# Patient Record
Sex: Male | Born: 1950 | Race: Black or African American | Hispanic: No | Marital: Single
Health system: Southern US, Community
[De-identification: ages and names within clinical notes are randomized; demographics above are authoritative.]

---

## 2014-09-10 ENCOUNTER — Inpatient Hospital Stay (HOSPITAL_COMMUNITY)
Admission: AD | Admit: 2014-09-10 | Discharge: 2014-09-13 | DRG: 871 | Disposition: A | Discharge status: Other Acute Inpatient Hospital | Payer: Medicare Other | Source: Other Acute Inpatient Hospital | Attending: Internal Medicine | Admitting: Internal Medicine

## 2014-09-10 DIAGNOSIS — Z930 Tracheostomy status: Secondary | ICD-10-CM | POA: Diagnosis not present

## 2014-09-10 DIAGNOSIS — E1310 Other specified diabetes mellitus with ketoacidosis without coma: Secondary | ICD-10-CM | POA: Diagnosis not present

## 2014-09-10 DIAGNOSIS — J9690 Respiratory failure, unspecified, unspecified whether with hypoxia or hypercapnia: Secondary | ICD-10-CM | POA: Diagnosis present

## 2014-09-10 DIAGNOSIS — F411 Generalized anxiety disorder: Secondary | ICD-10-CM

## 2014-09-10 DIAGNOSIS — Z9911 Dependence on respirator [ventilator] status: Secondary | ICD-10-CM

## 2014-09-10 DIAGNOSIS — D649 Anemia, unspecified: Secondary | ICD-10-CM | POA: Diagnosis present

## 2014-09-10 DIAGNOSIS — J9620 Acute and chronic respiratory failure, unspecified whether with hypoxia or hypercapnia: Secondary | ICD-10-CM

## 2014-09-10 DIAGNOSIS — E662 Morbid (severe) obesity with alveolar hypoventilation: Secondary | ICD-10-CM | POA: Diagnosis present

## 2014-09-10 DIAGNOSIS — G9340 Encephalopathy, unspecified: Secondary | ICD-10-CM | POA: Diagnosis present

## 2014-09-10 DIAGNOSIS — R7881 Bacteremia: Secondary | ICD-10-CM | POA: Diagnosis not present

## 2014-09-10 DIAGNOSIS — R6520 Severe sepsis without septic shock: Secondary | ICD-10-CM | POA: Diagnosis present

## 2014-09-10 DIAGNOSIS — N184 Chronic kidney disease, stage 4 (severe): Secondary | ICD-10-CM | POA: Diagnosis present

## 2014-09-10 DIAGNOSIS — N390 Urinary tract infection, site not specified: Secondary | ICD-10-CM | POA: Diagnosis present

## 2014-09-10 DIAGNOSIS — J398 Other specified diseases of upper respiratory tract: Secondary | ICD-10-CM

## 2014-09-10 DIAGNOSIS — R627 Adult failure to thrive: Secondary | ICD-10-CM

## 2014-09-10 DIAGNOSIS — E1110 Type 2 diabetes mellitus with ketoacidosis without coma: Secondary | ICD-10-CM

## 2014-09-10 DIAGNOSIS — J9611 Chronic respiratory failure with hypoxia: Secondary | ICD-10-CM

## 2014-09-10 DIAGNOSIS — E861 Hypovolemia: Secondary | ICD-10-CM | POA: Diagnosis present

## 2014-09-10 DIAGNOSIS — G4733 Obstructive sleep apnea (adult) (pediatric): Secondary | ICD-10-CM | POA: Diagnosis present

## 2014-09-10 DIAGNOSIS — R319 Hematuria, unspecified: Secondary | ICD-10-CM | POA: Diagnosis present

## 2014-09-10 DIAGNOSIS — I10 Essential (primary) hypertension: Secondary | ICD-10-CM | POA: Diagnosis not present

## 2014-09-10 DIAGNOSIS — R0600 Dyspnea, unspecified: Secondary | ICD-10-CM | POA: Diagnosis not present

## 2014-09-10 DIAGNOSIS — B9620 Unspecified Escherichia coli [E. coli] as the cause of diseases classified elsewhere: Secondary | ICD-10-CM | POA: Diagnosis present

## 2014-09-10 DIAGNOSIS — K219 Gastro-esophageal reflux disease without esophagitis: Secondary | ICD-10-CM | POA: Diagnosis present

## 2014-09-10 DIAGNOSIS — E875 Hyperkalemia: Secondary | ICD-10-CM | POA: Diagnosis not present

## 2014-09-10 DIAGNOSIS — N178 Other acute kidney failure: Secondary | ICD-10-CM | POA: Diagnosis not present

## 2014-09-10 DIAGNOSIS — A4102 Sepsis due to Methicillin resistant Staphylococcus aureus: Secondary | ICD-10-CM | POA: Diagnosis present

## 2014-09-10 DIAGNOSIS — I509 Heart failure, unspecified: Secondary | ICD-10-CM | POA: Diagnosis present

## 2014-09-10 DIAGNOSIS — Z931 Gastrostomy status: Secondary | ICD-10-CM

## 2014-09-10 DIAGNOSIS — T839XXA Unspecified complication of genitourinary prosthetic device, implant and graft, initial encounter: Secondary | ICD-10-CM

## 2014-09-10 DIAGNOSIS — Z87891 Personal history of nicotine dependence: Secondary | ICD-10-CM

## 2014-09-10 DIAGNOSIS — Z4659 Encounter for fitting and adjustment of other gastrointestinal appliance and device: Secondary | ICD-10-CM

## 2014-09-10 DIAGNOSIS — I129 Hypertensive chronic kidney disease with stage 1 through stage 4 chronic kidney disease, or unspecified chronic kidney disease: Secondary | ICD-10-CM | POA: Diagnosis present

## 2014-09-10 DIAGNOSIS — B964 Proteus (mirabilis) (morganii) as the cause of diseases classified elsewhere: Secondary | ICD-10-CM | POA: Diagnosis present

## 2014-09-10 DIAGNOSIS — J9622 Acute and chronic respiratory failure with hypercapnia: Secondary | ICD-10-CM | POA: Diagnosis not present

## 2014-09-10 DIAGNOSIS — Z6841 Body Mass Index (BMI) 40.0 and over, adult: Secondary | ICD-10-CM | POA: Diagnosis not present

## 2014-09-10 DIAGNOSIS — N179 Acute kidney failure, unspecified: Secondary | ICD-10-CM | POA: Diagnosis present

## 2014-09-10 DIAGNOSIS — E1122 Type 2 diabetes mellitus with diabetic chronic kidney disease: Secondary | ICD-10-CM | POA: Diagnosis present

## 2014-09-10 DIAGNOSIS — A419 Sepsis, unspecified organism: Secondary | ICD-10-CM

## 2014-09-10 LAB — STREP PNEUMONIAE URINARY ANTIGEN: Strep Pneumo Urinary Antigen: NEGATIVE

## 2014-09-10 LAB — COMPREHENSIVE METABOLIC PANEL
ALT: 16 U/L — ABNORMAL LOW (ref 17–63)
AST: 8 U/L — ABNORMAL LOW (ref 15–41)
Albumin: 2.2 g/dL — ABNORMAL LOW (ref 3.5–5.0)
Alkaline Phosphatase: 56 U/L (ref 38–126)
Anion gap: 10 (ref 5–15)
BILIRUBIN TOTAL: 0.4 mg/dL (ref 0.3–1.2)
BUN: 106 mg/dL — ABNORMAL HIGH (ref 6–20)
CO2: 26 mmol/L (ref 22–32)
Calcium: 8 mg/dL — ABNORMAL LOW (ref 8.9–10.3)
Chloride: 97 mmol/L — ABNORMAL LOW (ref 101–111)
Creatinine, Ser: 3.83 mg/dL — ABNORMAL HIGH (ref 0.61–1.24)
GFR calc non Af Amer: 16 mL/min — ABNORMAL LOW (ref >60)
GFR, EST AFRICAN AMERICAN: 18 mL/min — AB (ref >60)
Glucose, Bld: 138 mg/dL — ABNORMAL HIGH (ref 65–99)
Potassium: 5.1 mmol/L (ref 3.5–5.1)
Sodium: 133 mmol/L — ABNORMAL LOW (ref 135–145)
TOTAL PROTEIN: 6 g/dL — AB (ref 6.5–8.1)

## 2014-09-10 LAB — POCT I-STAT 3, ART BLOOD GAS (G3+)
Acid-Base Excess: 3 mmol/L — ABNORMAL HIGH (ref 0.0–2.0)
BICARBONATE: 28.5 meq/L — AB (ref 20.0–24.0)
O2 Saturation: 99 %
PH ART: 7.381 (ref 7.350–7.450)
PO2 ART: 158 mmHg — AB (ref 80.0–100.0)
TCO2: 30 mmol/L (ref 0–100)
pCO2 arterial: 48.1 mmHg — ABNORMAL HIGH (ref 35.0–45.0)

## 2014-09-10 LAB — URINALYSIS, ROUTINE W REFLEX MICROSCOPIC
BILIRUBIN URINE: NEGATIVE
Glucose, UA: NEGATIVE mg/dL
KETONES UR: NEGATIVE mg/dL
NITRITE: NEGATIVE
PH: 5 (ref 5.0–8.0)
PROTEIN: 100 mg/dL — AB
Specific Gravity, Urine: 1.015 (ref 1.005–1.030)
UROBILINOGEN UA: 0.2 mg/dL (ref 0.0–1.0)

## 2014-09-10 LAB — PHOSPHORUS: Phosphorus: 5.7 mg/dL — ABNORMAL HIGH (ref 2.5–4.6)

## 2014-09-10 LAB — MAGNESIUM: MAGNESIUM: 2 mg/dL (ref 1.7–2.4)

## 2014-09-10 LAB — CBC
HCT: 20.8 % — ABNORMAL LOW (ref 39.0–52.0)
Hemoglobin: 6.4 g/dL — CL (ref 13.0–17.0)
MCH: 26.9 pg (ref 26.0–34.0)
MCHC: 30.8 g/dL (ref 30.0–36.0)
MCV: 87.4 fL (ref 78.0–100.0)
Platelets: 282 10*3/uL (ref 150–400)
RBC: 2.38 MIL/uL — ABNORMAL LOW (ref 4.22–5.81)
RDW: 18.5 % — ABNORMAL HIGH (ref 11.5–15.5)
WBC: 12.4 10*3/uL — AB (ref 4.0–10.5)

## 2014-09-10 LAB — GLUCOSE, CAPILLARY
GLUCOSE-CAPILLARY: 154 mg/dL — AB (ref 65–99)
Glucose-Capillary: 151 mg/dL — ABNORMAL HIGH (ref 65–99)
Glucose-Capillary: 107 mg/dL — ABNORMAL HIGH (ref 65–99)

## 2014-09-10 LAB — LACTIC ACID, PLASMA: Lactic Acid, Venous: 0.8 mmol/L (ref 0.5–2.0)

## 2014-09-10 LAB — APTT: aPTT: 38 seconds — ABNORMAL HIGH (ref 24–37)

## 2014-09-10 LAB — AMYLASE: Amylase: 37 U/L (ref 28–100)

## 2014-09-10 LAB — PROTIME-INR
INR: 1.18 (ref 0.00–1.49)
Prothrombin Time: 15.2 seconds (ref 11.6–15.2)

## 2014-09-10 LAB — URINE MICROSCOPIC-ADD ON

## 2014-09-10 LAB — ABO/RH: ABO/RH(D): B POS

## 2014-09-10 LAB — BRAIN NATRIURETIC PEPTIDE: B NATRIURETIC PEPTIDE 5: 752.9 pg/mL — AB (ref 0.0–100.0)

## 2014-09-10 LAB — TROPONIN I

## 2014-09-10 LAB — PROCALCITONIN: PROCALCITONIN: 4.43 ng/mL

## 2014-09-10 LAB — LIPASE, BLOOD: LIPASE: 21 U/L — AB (ref 22–51)

## 2014-09-10 LAB — PREPARE RBC (CROSSMATCH)

## 2014-09-10 MED ORDER — CETYLPYRIDINIUM CHLORIDE 0.05 % MT LIQD
7.0000 mL | Freq: Four times a day (QID) | OROMUCOSAL | Status: DC
  Administered 2014-09-10 – 2014-09-13 (×13): 7 mL via OROMUCOSAL

## 2014-09-10 MED ORDER — HEPARIN SODIUM (PORCINE) 5000 UNIT/ML IJ SOLN
5000.0000 [IU] | Freq: Three times a day (TID) | INTRAMUSCULAR | Status: DC
  Administered 2014-09-10 – 2014-09-13 (×10): 5000 [IU] via SUBCUTANEOUS
  Filled 2014-09-10 (×12): qty 1

## 2014-09-10 MED ORDER — MEROPENEM 500 MG IV SOLR
500.0000 mg | INTRAVENOUS | Status: DC
  Filled 2014-09-10: qty 0.5

## 2014-09-10 MED ORDER — CHLORHEXIDINE GLUCONATE 0.12 % MT SOLN
15.0000 mL | Freq: Two times a day (BID) | OROMUCOSAL | Status: DC
  Administered 2014-09-10 – 2014-09-13 (×6): 15 mL via OROMUCOSAL
  Filled 2014-09-10 (×8): qty 15

## 2014-09-10 MED ORDER — IPRATROPIUM-ALBUTEROL 0.5-2.5 (3) MG/3ML IN SOLN
3.0000 mL | RESPIRATORY_TRACT | Status: DC
  Administered 2014-09-10 – 2014-09-13 (×18): 3 mL via RESPIRATORY_TRACT
  Filled 2014-09-10 (×3): qty 3
  Filled 2014-09-10: qty 12
  Filled 2014-09-10 (×13): qty 3

## 2014-09-10 MED ORDER — VANCOMYCIN HCL 10 G IV SOLR
2500.0000 mg | Freq: Once | INTRAVENOUS | Status: AC
  Administered 2014-09-10: 2500 mg via INTRAVENOUS
  Filled 2014-09-10: qty 2500

## 2014-09-10 MED ORDER — MUPIROCIN 2 % EX OINT
1.0000 "application " | TOPICAL_OINTMENT | Freq: Two times a day (BID) | CUTANEOUS | Status: DC
  Administered 2014-09-10 – 2014-09-13 (×7): 1 via NASAL
  Filled 2014-09-10 (×2): qty 22

## 2014-09-10 MED ORDER — SODIUM CHLORIDE 0.9 % IV SOLN: Freq: Once | INTRAVENOUS | Status: DC

## 2014-09-10 MED ORDER — SODIUM CHLORIDE 0.9 % IV SOLN
INTRAVENOUS | Status: DC
  Administered 2014-09-10: 23:00:00 via INTRAVENOUS

## 2014-09-10 MED ORDER — PANTOPRAZOLE SODIUM 40 MG IV SOLR
40.0000 mg | Freq: Every day | INTRAVENOUS | Status: DC
  Administered 2014-09-10 – 2014-09-11 (×2): 40 mg via INTRAVENOUS
  Filled 2014-09-10 (×3): qty 40

## 2014-09-10 MED ORDER — CHLORHEXIDINE GLUCONATE CLOTH 2 % EX PADS
6.0000 | MEDICATED_PAD | Freq: Every day | CUTANEOUS | Status: DC
  Administered 2014-09-11 – 2014-09-13 (×3): 6 via TOPICAL

## 2014-09-10 MED ORDER — INSULIN ASPART 100 UNIT/ML ~~LOC~~ SOLN
0.0000 [IU] | SUBCUTANEOUS | Status: DC
  Administered 2014-09-10: 3 [IU] via SUBCUTANEOUS
  Administered 2014-09-12 – 2014-09-13 (×3): 2 [IU] via SUBCUTANEOUS
  Administered 2014-09-13: 3 [IU] via SUBCUTANEOUS
  Administered 2014-09-13 (×2): 2 [IU] via SUBCUTANEOUS
  Administered 2014-09-13: 3 [IU] via SUBCUTANEOUS

## 2014-09-10 MED ORDER — LORAZEPAM 2 MG/ML IJ SOLN
1.0000 mg | INTRAMUSCULAR | Status: DC | PRN
  Administered 2014-09-12 – 2014-09-13 (×7): 2 mg via INTRAVENOUS
  Administered 2014-09-13: 4 mg via INTRAVENOUS
  Administered 2014-09-13: 2 mg via INTRAVENOUS
  Administered 2014-09-13: 4 mg via INTRAVENOUS
  Administered 2014-09-13: 2 mg via INTRAVENOUS
  Filled 2014-09-10 (×9): qty 1
  Filled 2014-09-10: qty 2
  Filled 2014-09-10 (×2): qty 1
  Filled 2014-09-10 (×2): qty 2

## 2014-09-10 MED ORDER — SODIUM CHLORIDE 0.9 % IV SOLN
500.0000 mg | Freq: Two times a day (BID) | INTRAVENOUS | Status: DC
  Administered 2014-09-10 – 2014-09-13 (×7): 500 mg via INTRAVENOUS
  Filled 2014-09-10 (×9): qty 0.5

## 2014-09-10 NOTE — Progress Notes (Signed)
ANTIBIOTIC CONSULT NOTE - INITIAL  Pharmacy Consult for Merrem, Vancomycin Indication: rule out sepsis and MDR EColi & Proteus UTI + MRSA bacteremia  Allergies  Allergen Reactions  . Amoxicillin     rash  . Fentanyl    Patient Measurements: Height: 5\' 6"  (167.6 cm) IBW/kg (Calculated) : 63.8  Weight - 156.5 kg  Vital Signs: Temp: 97.9 F (36.6 C) (06/03 1058) Temp Source: Oral (06/03 1058) BP: 123/60 mmHg (06/03 1055) Pulse Rate: 63 (06/03 1100) Intake/Output from previous day:   Intake/Output from this shift:    Labs: No results for input(s): WBC, HGB, PLT, LABCREA, CREATININE in the last 72 hours. CrCl cannot be calculated (Unknown ideal weight.). No results for input(s): VANCOTROUGH, VANCOPEAK, VANCORANDOM, GENTTROUGH, GENTPEAK, GENTRANDOM, TOBRATROUGH, TOBRAPEAK, TOBRARND, AMIKACINPEAK, AMIKACINTROU, AMIKACIN in the last 72 hours.   Microbiology: No results found for this or any previous visit (from the past 720 hour(s)).  Medical History: No past medical history on file.  Assessment: [age] YOM transferred from [facility] (3/24 to 6/3) on 6/3 for sepsis after becoming febrile up to 102 on 5/30 with MRSA bacteremia and Proteus/EColi MDR UTI on vancomycin and merrem since 5/30. Per [facility] chart - Merrem 1g 5/31 then 500 q24 & Vanc 1g 5/31 x1, then VR level on 6/1 low at 7.3 - no further doses documented. Last dose Merrem was 6/2 at 2100. Last dose of vanc 5/31-none documented on June MAR.  Currently afebrile with copious thick secretions. AoCKD - SCr 3.83 (baseline 3.7). Estimated CrCl ~20 mL/min. Patient is making urine per RN (about 100 cc out since arrival at 10AM).   Vanc 5/31 >> VR 6/1 =7.3 after 1g x1 Merrem 5/31 >> (500 q24 @ [facility]) Cefepime 4/29 >>5/1  5/27 Blood >> MRSA 5/29 Urine >> MDR Ecoli/Proteus (S- Imipenem)  Goal of Therapy:  Vancomycin trough level 15-20 mcg/ml  Clinical resolution of infection  Plan:  Increase Merrem 500 mg IV every 12  hours.  Reload Vancomycin at 2500 mg IV now, then 2g IV every 48 hours.  Follow-up renal function, culture results, and clinical status.   Link SnufferJessica Florance Paolillo, PharmD, BCPS Clinical Pharmacist [phone] 09/10/2014,12:07 PM

## 2014-09-10 NOTE — Progress Notes (Signed)
Speech Pathology:  Orders received for swallowing evaluation.  Pt not medically ready per discussion with Dr. Delton CoombesByrum.  Per RN's discussion with [facility], pt was eating a mechanical soft diet with nectar-thick liquids PTA.  When pt medically ready, and if warranted, suggest proceeding with FEES.  SLPs certified to perform FEES will not be available until Monday, 6/6.  Karey Suthers L. Samson Fredericouture, [place] CCC/SLP Pager [phone]

## 2014-09-10 NOTE — Progress Notes (Signed)
Initial Nutrition Assessment  DOCUMENTATION CODES:  Morbid obesity  INTERVENTION:   Since will not be able to advance PO diet for at least 3 more days, recommend initiate TF via NG/OGT with Vital High Protein at 25 ml/h and Prostat 30 ml BID on day 1; on day 2, d/c Protstat and increase to goal rate of 80 ml/h (1920 ml per day) to provide 1920 kcals, 168 gm protein, 1605 ml free water daily.  NUTRITION DIAGNOSIS:  Inadequate oral intake related to inability to eat as evidenced by NPO status.  GOAL:  Provide needs based on ASPEN/SCCM guidelines  MONITOR:  Vent status, Diet advancement, PO intake, Other (Comment) (need to start enteral nutrition support)  REASON FOR ASSESSMENT:  Ventilator     ASSESSMENT: Patient admitted from [facility] on 6/3 with sepsis, acute on chronic renal failure, no shock. Source MRSA bacteremia + E coli / proteus UTI. 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   Patient usually receives a mechanical soft diet with nectar thick liquids at [facility]. SLP consulted for swallow evaluation; patient not ready for PO's at this time. SLP recommends FEES when patient able to participate. SLP can perform FEES on Monday at the earliest.   Patient is currently intubated on ventilator support MV: 11.2 L/min Temp (24hrs), Avg:97.8 F (36.6 C), Min:97.6 F (36.4 C), Max:97.9 F (36.6 C)  Propofol: none  Labs reviewed: sodium low, phosphorus high.   Height:  Ht Readings from Last 1 Encounters:  09/10/14 5\' 6"  (1.676 m)    Weight:  Wt Readings from Last 1 Encounters:  09/10/14 345 lb 1.6 oz (156.536 kg)    Ideal Body Weight:  64.5 kg  Wt Readings from Last 10 Encounters:  09/10/14 345 lb 1.6 oz (156.536 kg)    BMI:  Body mass index is 55.73 kg/(m^2).  Estimated Nutritional Needs:  Kcal:  1610-96041716-2184  Protein:  > 160 gm  Fluid:  >/= 2 L  Skin:  Reviewed, no issues  Diet Order:  Diet NPO time specified  EDUCATION  NEEDS:  No education needs identified at this time   Intake/Output Summary (Last 24 hours) at 09/10/14 1417 Last data filed at 09/10/14 1400  Gross per 24 hour  Intake    725 ml  Output    150 ml  Net    575 ml    Last BM:  unknown   Joaquin CourtsKimberly Elise Knobloch, RD, LDN, CNSC Pager [phone] After Hours Pager [phone]

## 2014-09-10 NOTE — Care Management Note (Signed)
Case Management Note  Patient Details  Name: Isaiah Montgomery MRN: [id number] Date of Birth: 1950-06-11  Subjective/Objective:   64 y.o. M admitted with Bacteremia E Coli proteus UTI. Acute on Chronic Renal Failure. Pt is from [facility].                  Action/Plan:   Expected Discharge Date:                  Expected Discharge Plan:   (From [facility]; Chronic Trach and Vent)  In-House Referral:     Discharge planning Services  CM Consult  Post Acute Care Choice:    Choice offered to:     DME Arranged:    DME Agency:     HH Arranged:    HH Agency:     Status of Service:  In process, will continue to follow  Medicare Important Message Given:    Date Medicare IM Given:    Medicare IM give by:    Date Additional Medicare IM Given:    Additional Medicare Important Message give by:     If discussed at Long Length of Stay Meetings, dates discussed:    Additional Comments:  Yvone NeuCrutchfield, [name], RN 09/10/2014, 3:34 PM

## 2014-09-10 NOTE — H&P (Signed)
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  [facility] and acute on chronic renal failure, no shock.  Source MRSA bacteremia + E coli / proteus UTI.   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 He is unable to wean due to his tracheomalacia, MO and concurrent medical issues. [facility] when on [facility]/30 he became febrile to 102. Blood cultured were + Staph(mrsa) and urine was + proteus/ecoli. His foley was dc'd 6/2 and [facility] team was unable to replace. He was refused readmit to ICU section of [facility]. He is HD stable, no foley is in place. He has a new PICC placed  6/3 and has been on vanc/merropenem x 24 hours. He further has worsening renal failure(cret 4,0) (baseline 3.7)and may need HD in near future.    PAST MEDICAL HISTORY :     Tracheostomy dependent   DM (diabetes mellitus) type 2,   Morbid obesity   Ventilator dependence   HTN (hypertension)   Tracheomalacia   FTT (failure to thrive) in adult   Bacteremia   UTI (urinary tract infection) proteus/ecoli   Hematuria   Acute renal failure   Foley catheter problem, foley out and unable to replace   Generalized anxiety disorder  Prior to Admission medications   Not on File   Allergies  Allergen Reactions  . Amoxicillin     rash  . Fentanyl     FAMILY HISTORY:  has no family status information on file.  SOCIAL  HISTORY:  Former smoker  REVIEW OF SYSTEMS:  na  SUBJECTIVE:  Awake and interacting, mild distress.  [facility]  VITAL SIGNS: Temp:  [97.6 F (36.4 C)-97.9 F (36.6 C)] 97.6 F (36.4 C) (06/03 1229) Pulse Rate:  [63-117] 117 (06/03 1300) Resp:  [17-25] 18 (06/03 1300) BP: (123-138)/(60-77) 124/75 mmHg (06/03 1300) SpO2:  [97 %-100 %] 97 % (06/03 1300) FiO2 (%):  [50 %] 50 % (06/03 1255) Weight:  [156.536 kg (345 lb 1.6 oz)] 156.536 kg (345 lb 1.6 oz) (06/03 1100) HEMODYNAMICS:   VENTILATOR SETTINGS: Vent Mode:  [-] PRVC FiO2 (%):  [50 %] 50 % Set Rate:  [14 bmp] 14 bmp Vt Set:  [600 mL] 600 mL PEEP:  [5 cmH20] 5 cmH20 Plateau Pressure:  [34 cmH20] 34 cmH20 INTAKE / OUTPUT:  Intake/Output Summary (Last 24 hours) at 09/10/14 1308 Last data filed at 09/10/14 1300  Gross per 24 hour  Intake    575 ml  Output      0 ml  Net    575 ml    PHYSICAL EXAMINATION: General: MOAAM, difficult to arouse. MAEx 4 Neuro:  No follows commands(recently sedated)MAEx 4 HEENT: Ocular edema noted, perl, oral mucosa moist Cardiovascular:  HSD Lungs:  +rhonchi and exp wheeze Abdomen:  Obese + bs, non tender, ecchymosis spotted on abd Musculoskeletal:  intact Skin:  LE edema,  chronic venous stasis   LABS:  CBC No results for input(s): WBC, HGB, HCT, PLT in the last 168 hours. Coag's No results for input(s): APTT, INR in the last 168 hours. BMET No results for input(s): NA, K, CL, CO2, BUN, CREATININE, GLUCOSE in the last 168 hours. Electrolytes No results for input(s): CALCIUM, MG, PHOS in the last 168 hours. Sepsis Markers No results for input(s): LATICACIDVEN, PROCALCITON, O2SATVEN in the last 168 hours. ABG  Recent Labs Lab 09/10/14 1143  PHART 7.381  PCO2ART 48.1*  PO2ART 158.0*   Liver Enzymes No results for input(s): AST, ALT, ALKPHOS, BILITOT, ALBUMIN in the last 168 hours. Cardiac Enzymes No results for input(s): TROPONINI, PROBNP in the last  168 hours. Glucose  Recent Labs Lab 09/10/14 1053 09/10/14 1224  GLUCAP 151* 154*    Imaging No results found.   ASSESSMENT / PLAN:  PULMONARY Trach 4/16>> A: Chronic vent dependance Chronic trach Tracheomalacia OHS OSA MO  P:   Continue current vent settings, he likely will be on vent life long NPO until Swallow eval to assess aspiration CxR/abg BD  CARDIOVASCULAR CVL6/2 left picc> A:  HTN CHF  P:  Antihypertensives as needed 2 d echo  RENAL A:   Chronic renal insuff, base creatine 3.7 P:   Volume resuscitate Place foley to accurately measure I/O Follow BMP Renal evaluation if GFR worsens, ? Whether he is an HD candidate given chronic illness.   GASTROINTESTINAL A:   GERD P:   PPI NPO for now  HEMATOLOGIC A:   No acute issues P:  CBC  INFECTIOUS A:  MRSA bacteremia E coli / proteus UTI Severe sepsis without shock P:   BCx2 5/30 mrsa UC 5/30 proteus/ecoli  Sputum 6/3>>  Abx:  5/30 vanco>> 5/30 Merrem>>  PICC has been changed on 6/2 (after vanco started), should be able to leave in place  ENDOCRINE A:   DM IDDM P:   SSI  NEUROLOGIC A:  Chronic anxiety  Encephalopathy P:   RASS goal: 0 to 1 Hold sedatives until more awake and able to assess MS  May need CT head if MS not clearing   FAMILY  - Updates: None at bedside  - Inter-disciplinary family meet or Palliative Care meeting due by: 6/10    Brett Canales Minor ACNP Adolph Pollack PCCM Pager [phone] till 3 pm If no answer page [phone] 09/10/2014, 1:08 PM  Attending Note:  I have examined patient, reviewed labs, studies and notes. I have discussed the case with S Minor, and I agree with the data and plans as amended above. Morbidly obese man with OHS, tracheomalacia that have necessitated chronic vent, tracheostomy. Transfers to [facility] with severe sepsis due to MRSA bacteremia, E coli / proteus UTI. On my eval he is ventilated, waking up, hemodynamically stable. Foley has been  placed. He has been on imipenem and vanco since 5/30. I will continue his abx, leave the new PICC in place. Try to push towards PSV, although I suspect he will not be able to achieve total vent freedom.  Independent critical care time is 50 minutes.   Levy Pupa, MD, PhD 09/10/2014, 1:12 PM [facility] [phone] or if no answer [phone]

## 2014-09-11 ENCOUNTER — Inpatient Hospital Stay (HOSPITAL_COMMUNITY): Payer: Medicare Other

## 2014-09-11 DIAGNOSIS — G9340 Encephalopathy, unspecified: Secondary | ICD-10-CM | POA: Insufficient documentation

## 2014-09-11 DIAGNOSIS — J9620 Acute and chronic respiratory failure, unspecified whether with hypoxia or hypercapnia: Secondary | ICD-10-CM

## 2014-09-11 DIAGNOSIS — A419 Sepsis, unspecified organism: Secondary | ICD-10-CM

## 2014-09-11 DIAGNOSIS — R6520 Severe sepsis without septic shock: Secondary | ICD-10-CM

## 2014-09-11 DIAGNOSIS — R0600 Dyspnea, unspecified: Secondary | ICD-10-CM

## 2014-09-11 LAB — BLOOD GAS, ARTERIAL
ACID-BASE EXCESS: 1 mmol/L (ref 0.0–2.0)
Acid-Base Excess: 1.5 mmol/L (ref 0.0–2.0)
Bicarbonate: 25.6 mEq/L — ABNORMAL HIGH (ref 20.0–24.0)
Bicarbonate: 26.1 mEq/L — ABNORMAL HIGH (ref 20.0–24.0)
Drawn by: 312761
Drawn by: 39899
FIO2: 0.5 %
FIO2: 0.5 %
LHR: 14 {breaths}/min
O2 SAT: 95.7 %
O2 Saturation: 94.6 %
PEEP/CPAP: 10 cmH2O
PEEP/CPAP: 10 cmH2O
PH ART: 7.362 (ref 7.350–7.450)
Patient temperature: 101.4
Patient temperature: 100.4
RATE: 14 resp/min
TCO2: 27 mmol/L (ref 0–100)
TCO2: 27.5 mmol/L (ref 0–100)
VT: 600 mL
VT: 600 mL
pCO2 arterial: 47.8 mmHg — ABNORMAL HIGH (ref 35.0–45.0)
pCO2 arterial: 47.7 mmHg — ABNORMAL HIGH (ref 35.0–45.0)
pH, Arterial: 7.357 (ref 7.350–7.450)
pO2, Arterial: 87.1 mmHg (ref 80.0–100.0)
pO2, Arterial: 85.8 mmHg (ref 80.0–100.0)

## 2014-09-11 LAB — CBC
HEMATOCRIT: 23.5 % — AB (ref 39.0–52.0)
HEMOGLOBIN: 7.1 g/dL — AB (ref 13.0–17.0)
MCH: 26.5 pg (ref 26.0–34.0)
MCHC: 30.2 g/dL (ref 30.0–36.0)
MCV: 87.7 fL (ref 78.0–100.0)
Platelets: 349 10*3/uL (ref 150–400)
RBC: 2.68 MIL/uL — AB (ref 4.22–5.81)
RDW: 18.6 % — AB (ref 11.5–15.5)
WBC: 10.9 10*3/uL — ABNORMAL HIGH (ref 4.0–10.5)

## 2014-09-11 LAB — URINE CULTURE: Special Requests: NORMAL

## 2014-09-11 LAB — MRSA PCR SCREENING: MRSA by PCR: POSITIVE — AB

## 2014-09-11 LAB — BASIC METABOLIC PANEL
ANION GAP: 9 (ref 5–15)
Anion gap: 11 (ref 5–15)
BUN: 107 mg/dL — ABNORMAL HIGH (ref 6–20)
BUN: 111 mg/dL — ABNORMAL HIGH (ref 6–20)
CALCIUM: 7.9 mg/dL — AB (ref 8.9–10.3)
CO2: 26 mmol/L (ref 22–32)
CO2: 25 mmol/L (ref 22–32)
CREATININE: 3.63 mg/dL — AB (ref 0.61–1.24)
Calcium: 8.2 mg/dL — ABNORMAL LOW (ref 8.9–10.3)
Chloride: 102 mmol/L (ref 101–111)
Chloride: 99 mmol/L — ABNORMAL LOW (ref 101–111)
Creatinine, Ser: 3.7 mg/dL — ABNORMAL HIGH (ref 0.61–1.24)
GFR calc Af Amer: 19 mL/min — ABNORMAL LOW (ref >60)
GFR calc non Af Amer: 17 mL/min — ABNORMAL LOW (ref >60)
GFR calc non Af Amer: 16 mL/min — ABNORMAL LOW (ref >60)
GFR, EST AFRICAN AMERICAN: 19 mL/min — AB (ref >60)
GLUCOSE: 94 mg/dL (ref 65–99)
Glucose, Bld: 107 mg/dL — ABNORMAL HIGH (ref 65–99)
Potassium: 5.6 mmol/L — ABNORMAL HIGH (ref 3.5–5.1)
Potassium: 5.1 mmol/L (ref 3.5–5.1)
Sodium: 139 mmol/L (ref 135–145)
Sodium: 133 mmol/L — ABNORMAL LOW (ref 135–145)

## 2014-09-11 LAB — GLUCOSE, CAPILLARY
Glucose-Capillary: 104 mg/dL — ABNORMAL HIGH (ref 65–99)
Glucose-Capillary: 105 mg/dL — ABNORMAL HIGH (ref 65–99)
Glucose-Capillary: 110 mg/dL — ABNORMAL HIGH (ref 65–99)
Glucose-Capillary: 90 mg/dL (ref 65–99)
Glucose-Capillary: 92 mg/dL (ref 65–99)
Glucose-Capillary: 94 mg/dL (ref 65–99)
Glucose-Capillary: 98 mg/dL (ref 65–99)

## 2014-09-11 LAB — TROPONIN I: Troponin I: <0.03 ng/mL (ref <0.031)

## 2014-09-11 LAB — CORTISOL: CORTISOL PLASMA: 7.7 ug/dL

## 2014-09-11 MED ORDER — SODIUM CHLORIDE 0.9 % IV BOLUS (SEPSIS)
1000.0000 mL | Freq: Once | INTRAVENOUS | Status: AC
  Administered 2014-09-11: 1000 mL via INTRAVENOUS

## 2014-09-11 MED ORDER — SODIUM POLYSTYRENE SULFONATE 15 GM/60ML PO SUSP
30.0000 g | Freq: Once | ORAL | Status: AC
  Administered 2014-09-11: 30 g
  Filled 2014-09-11: qty 120

## 2014-09-11 MED ORDER — VITAL HIGH PROTEIN PO LIQD
1000.0000 mL | ORAL | Status: DC
  Administered 2014-09-11: 19:00:00
  Administered 2014-09-11 – 2014-09-13 (×4): 1000 mL
  Filled 2014-09-11 (×6): qty 1000

## 2014-09-11 MED ORDER — METOPROLOL TARTRATE 1 MG/ML IV SOLN
5.0000 mg | INTRAVENOUS | Status: AC | PRN
  Administered 2014-09-12 (×2): 5 mg via INTRAVENOUS
  Filled 2014-09-11 (×2): qty 5

## 2014-09-11 MED ORDER — PRO-STAT SUGAR FREE PO LIQD
30.0000 mL | Freq: Two times a day (BID) | ORAL | Status: AC
  Administered 2014-09-11 (×2): 30 mL
  Filled 2014-09-11 (×2): qty 30

## 2014-09-11 MED ORDER — PERFLUTREN LIPID MICROSPHERE
1.0000 mL | INTRAVENOUS | Status: AC | PRN
  Administered 2014-09-11: 2 mL via INTRAVENOUS
  Filled 2014-09-11: qty 10

## 2014-09-11 NOTE — Progress Notes (Signed)
  Echocardiogram 2D Echocardiogram has been performed.  Delcie RochENNINGTON, Treyvon Blahut 09/11/2014, 9:36 AM

## 2014-09-11 NOTE — Progress Notes (Signed)
Nutrition Follow-up / Consult  DOCUMENTATION CODES:  Morbid obesity  INTERVENTION:   Initiate TF via NGT with Vital High Protein at 25 ml/h and Prostat 30 ml BID on day 1; on day 2, d/c Protstat and increase to goal rate of 80 ml/h (1920 ml per day) to provide 1920 kcals, 168 gm protein, 1605 ml free water daily.   NUTRITION DIAGNOSIS:  Inadequate oral intake related to inability to eat as evidenced by NPO status.  Ongoing  GOAL:  Provide needs based on ASPEN/SCCM guidelines  Unmet   MONITOR:  Vent status, TF tolerance, Weight trends, Labs  REASON FOR ASSESSMENT:  Consult Enteral/tube feeding initiation and management  ASSESSMENT: Patient admitted from [facility] on 6/3 with sepsis, acute on chronic renal failure, no shock. Source MRSA bacteremia + E coli / proteus UTI. 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 Consumes a mechanical soft diet with nectar thick liquids at [facility].  Since SLP unable to perform FEES until Monday at the earliest, plans to start TF after NGT placed. Received MD Consult for TF initiation and management.  Patient is currently intubated on ventilator support MV: 10.3 L/min Temp (24hrs), Avg:100.3 F (37.9 C), Min:97.4 F (36.3 C), Max:101.5 F (38.6 C)  Propofol: none   Height:  Ht Readings from Last 1 Encounters:  09/10/14 5\' 6"  (1.676 m)    Weight:  Wt Readings from Last 1 Encounters:  09/11/14 344 lb 2.2 oz (156.1 kg)   09/10/14 345 lb 1.6 oz (156.536 kg)        Ideal Body Weight:  64.5 kg  BMI:  Body mass index is 55.57 kg/(m^2).  Estimated Nutritional Needs:  Kcal:  1610-96041716-2184  Protein:  > 160 gm  Fluid:  >/= 2 L  Skin:  Reviewed, no issues  Diet Order:  Diet NPO time specified  EDUCATION NEEDS:  No education needs identified at this time   Intake/Output Summary (Last 24 hours) at 09/11/14 1243 Last data filed at 09/11/14 1208  Gross per 24 hour  Intake   2325 ml  Output   1080  ml  Net   1245 ml    Last BM:  PTA   Joaquin CourtsKimberly Viki Carrera, RD, LDN, CNSC Pager [phone] After Hours Pager [phone]

## 2014-09-11 NOTE — Progress Notes (Signed)
Attempted to contact patients family, unable to reach at this time.

## 2014-09-11 NOTE — Progress Notes (Signed)
Pt have order to transfuse unit of blood. Pt niece called multiple times and message left on voice mail to get consent for pt's transfusion.Will continue to called pt's niece. And monitor pt's labs.

## 2014-09-11 NOTE — Progress Notes (Signed)
eLink Physician-Brief Progress Note Patient Name: Isaiah DarbyWilliam Montgomery DOB: Jan 15, 1951 MRN: [id number]   Date of Service  09/11/2014  HPI/Events of Note  New A fib  eICU Interventions  Would like to avoid rate controlling meds since this is likely a result of his hypovolemia, anemia, sepsis. Working to correct underlying issues. Will order metoprolol for rate control in event he develops RVR     Intervention Category Major Interventions: Arrhythmia - evaluation and management  Kortny Lirette S. 09/11/2014, 12:11 AM

## 2014-09-11 NOTE — Progress Notes (Signed)
Pt HR increased to 110-120's A. Fib. EKG done results A, Fib RVR. Informed Dr. Delton CoombesByrum PRN orders placed.

## 2014-09-11 NOTE — Progress Notes (Signed)
PULMONARY / CRITICAL CARE MEDICINE   Name: Isaiah Montgomery MRN: [id number] DOB: Apr 16, 1950    ADMISSION DATE:  09/10/2014   REFERRING MD :  [facility]  CHIEF COMPLAINT:  FTT  INITIAL PRESENTATION:  Mr. 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 He is unable to wean due to his tracheomalacia, MO and concurrent medical issues. He was in SNF division of [facility] when on 5/30 he became febrile to 102. Blood cultured were + Staph(mrsa) and urine was + proteus/ecoli. His foley was dc'd 6/2 and [facility] team was unable to replace. He was refused readmit to ICU section of [facility] and transferred to [facility] for further treatment. He is HD stable, no foley is in place. He has a new PICC placed  6/3 and has been on vanc/merropenem x 24 hours. He further has worsening renal failure(cret 4,0) (baseline 3.7)and may need HD in near future.    EVENTS 09/10/2014  : Morbidly obese man with OHS, tracheomalacia that have necessitated chronic vent, tracheostomy. Transfers to [facility] with severe sepsis due to MRSA bacteremia, E coli / proteus UTI. On my eval he is ventilated, waking up, hemodynamically stable. Foley has been placed. He has been on imipenem and vanco since 5/30. I will continue his abx, leave the new PICC in place. Try to push towards PSV, although I suspect he will not be able to achieve total vent freedom. [facility] 09/10/2014  6/3 TTE >> 6/3 renal US>> 09/10/14: Awake and interacting, mild distress.  Foley placed this am at [facility]    SUBJECTIVE/OVERNIGHT/INTERVAL HX 09/11/14: Hgb wsa < 7gm% and now > 7gm% without transfusion. RN reporting no family availability despite multiple attempts. Phone numbers are not the right place. On vent.  Per RN:  Not as arousable as yesterday.   Labs: Creat some improved   VITAL SIGNS: Temp:   [97.4 F (36.3 C)-101.5 F (38.6 C)] 100.9 F (38.3 C) (06/04 0748) Pulse Rate:  [63-126] 109 (06/04 0805) Resp:  [15-25] 16 (06/04 0805) BP: (82-160)/(47-94) 118/64 mmHg (06/04 0805) SpO2:  [86 %-100 %] 95 % (06/04 0805) FiO2 (%):  [50 %] 50 % (06/04 0717) Weight:  [156.1 kg (344 lb 2.2 oz)-156.536 kg (345 lb 1.6 oz)] 156.1 kg (344 lb 2.2 oz) (06/04 0253) HEMODYNAMICS:   VENTILATOR SETTINGS: Vent Mode:  [-] PRVC FiO2 (%):  [50 %] 50 % Set Rate:  [14 bmp] 14 bmp Vt Set:  [600 mL] 600 mL PEEP:  [5 cmH20-10 cmH20] 10 cmH20 Plateau Pressure:  [20 cmH20-34 cmH20] 29 cmH20 INTAKE / OUTPUT:  Intake/Output Summary (Last 24 hours) at 09/11/14 0933 Last data filed at 09/11/14 0900  Gross per 24 hour  Intake   2175 ml  Output    905 ml  Net   1270 ml    PHYSICAL EXAMINATION: General: MOAAM, difficult to arouse. MAEx 4 Neuro:  RASS -2/-3 equivalent. No focal deficits HEENT: Ocular edema noted, perl, oral mucosa moist Cardiovascular:  HSD Lungs:  NO honchi and exp wheeze. Sync with vent Abdomen:  Obese + bs, non tender, ecchymosis spotted on abd Musculoskeletal:  intact Skin:  LE edema, chronic venous stasis   LABS:  PULMONARY  Recent Labs Lab 09/10/14 1143 09/11/14 0355  PHART 7.381 7.357  PCO2ART 48.1* 47.8*  PO2ART 158.0* 87.1  HCO3 28.5* 25.6*  TCO2 30 27.0  O2SAT 99.0 94.6  CBC  Recent Labs Lab 09/10/14 1250 09/11/14 0505  HGB 6.4* 7.1*  HCT 20.8* 23.5*  WBC 12.4* 10.9*  PLT 282 349    COAGULATION  Recent Labs Lab 09/10/14 1250  INR 1.18    CARDIAC   Recent Labs Lab 09/10/14 1250 09/10/14 1510 09/10/14 2329  TROPONINI <0.03 <0.03 <0.03   No results for input(s): PROBNP in the last 168 hours.   CHEMISTRY  Recent Labs Lab 09/10/14 1250 09/11/14 0505  NA 133* 139  K 5.1 5.6*  CL 97* 102  CO2 26 26  GLUCOSE 138* 94  BUN 106* 107*  CREATININE 3.83* 3.63*  CALCIUM 8.0* 8.2*  MG 2.0  --   PHOS 5.7*  --    Estimated Creatinine  Clearance: 30.1 mL/min (by C-G formula based on Cr of 3.63).   LIVER  Recent Labs Lab 09/10/14 1250  AST 8*  ALT 16*  ALKPHOS 56  BILITOT 0.4  PROT 6.0*  ALBUMIN 2.2*  INR 1.18     INFECTIOUS  Recent Labs Lab 09/10/14 1250 09/10/14 1422  LATICACIDVEN  --  0.8  PROCALCITON 4.43  --      ENDOCRINE CBG (last 3)   Recent Labs  09/11/14 0011 09/11/14 0325 09/11/14 0750  GLUCAP 90 105* 104*         IMAGING x48h  - image(s) personally visualized  -   highlighted in bold US Renal  09/11/2014   CLINICAL DATA:  Hematuria  EXAM: RENAL / URINARY TRACT ULTRASOUND COMPLETE  COMPARISON:  None.  FINDINGS: Right Kidney:  Length: 13.8 cm. Normal renal cortical thickness and grossly normal echogenicity. Small cysts are noted. No hydronephrosis. No obvious renal calculi.  Left Kidney:  Length: 14 cm. Normal renal cortical thickness and echogenicity. Small cysts are noted. No hydronephrosis or definite renal calculi.  Bladder:  Decompressed by a Foley catheter.  IMPRESSION: Small bilateral renal cysts but no worrisome renal lesions, hydronephrosis or definite renal calculi.   Electronically Signed   By: Rudie Meyer M.D.   On: 09/11/2014 09:12         ASSESSMENT / PLAN:  PULMONARY Trach 4/16>> A: Chronic vent dependance Chronic trach Tracheomalacia OHS OSA MO   - continues on full vent support 09/11/14  P:   Continue current vent settings, he likely will be on vent life long NPO until Swallow eval to assess aspiration CxR/abg BD  CARDIOVASCULAR CVL6/2 left picc> A:  HTN CHF  - severe sepsis. NOt on pressors  P:  Antihypertensives as needed 2 d echo - results pending 09/11/14  RENAL A:   Chronic renal insuff, base creatine 3.7   - creat some better with some fluids. Mild Hyperkalemia  P:   Volume resuscitate -g ive another fluid bolus Kayexalate Repeat bmet 4pm  Place foley to accurately measure I/O Follow BMP Renal evaluation if GFR worsens, ?  Whether he is an HD candidate given chronic illness.   GASTROINTESTINAL A:   GERD P:   PPI NPO for now Place panda and start tube feeds  HEMATOLOGIC A:   Anemia of critical illness - hovering around transfusion threshold. Unable t o consent family - cannot be reached  P:  CBC PRBC if hgb < 7gm%  INFECTIOUS A:  MRSA bacteremia E coli / proteus UTI Severe sepsis without shock   - not on presors  P:   BCx2 5/30 mrsa UC 5/30 proteus/ecoli  Sputum 6/3>>  Abx:  5/30 vanco>> 5/30 Merrem>>  PICC has been  changed on 6/2 (after vanco started), should be able to leave in place  ENDOCRINE A:   DM IDDM P:   SSI  NEUROLOGIC A:  Chronic anxiety  Encephalopathy  - RASS -2/-3 equivalent. Unclear if worse or better than yestrerday. Did get ativan in setting of renal insuff 09/10/14 P:   RASS goal: 0 to 1 Hold sedatives until more awake and able to assess MS  - hold to extent possibe Get CT head wo contrast Check ABG  FAMILY  - Updates: None at bedside. Cannot be reached 09/11/14  - Inter-disciplinary family meet or Palliative Care meeting due by: 6/10  GLOBAL Move to sdu. No sedation gtt need  . Dr. Kalman ShanMurali Jere Vanburen, M.D., [facility].C.C.P Pulmonary and Critical Care Medicine Staff Physician [facility] Pager: [phone], If no answer or between  15:00h - 7:00h: call [phone]  09/11/2014 9:39 AM

## 2014-09-11 NOTE — Progress Notes (Signed)
Patient transported to CT and back to room 2M02 without any complications. Will continue to monitor.  

## 2014-09-12 ENCOUNTER — Inpatient Hospital Stay (HOSPITAL_COMMUNITY): Payer: Medicare Other

## 2014-09-12 DIAGNOSIS — I10 Essential (primary) hypertension: Secondary | ICD-10-CM

## 2014-09-12 DIAGNOSIS — Z9911 Dependence on respirator [ventilator] status: Secondary | ICD-10-CM

## 2014-09-12 DIAGNOSIS — J9622 Acute and chronic respiratory failure with hypercapnia: Secondary | ICD-10-CM

## 2014-09-12 DIAGNOSIS — N178 Other acute kidney failure: Secondary | ICD-10-CM

## 2014-09-12 LAB — CBC WITH DIFFERENTIAL/PLATELET
BASOS ABS: 0 10*3/uL (ref 0.0–0.1)
Basophils Relative: 0 % (ref 0–1)
EOS PCT: 1 % (ref 0–5)
Eosinophils Absolute: 0.1 10*3/uL (ref 0.0–0.7)
HCT: 20.8 % — ABNORMAL LOW (ref 39.0–52.0)
Hemoglobin: 6.5 g/dL — CL (ref 13.0–17.0)
Lymphocytes Relative: 13 % (ref 12–46)
Lymphs Abs: 1.5 10*3/uL (ref 0.7–4.0)
MCH: 27.3 pg (ref 26.0–34.0)
MCHC: 31.3 g/dL (ref 30.0–36.0)
MCV: 87.4 fL (ref 78.0–100.0)
MONO ABS: 0.5 10*3/uL (ref 0.1–1.0)
MONOS PCT: 4 % (ref 3–12)
Neutro Abs: 9.5 10*3/uL — ABNORMAL HIGH (ref 1.7–7.7)
Neutrophils Relative %: 82 % — ABNORMAL HIGH (ref 43–77)
Platelets: 361 10*3/uL (ref 150–400)
RBC: 2.38 MIL/uL — ABNORMAL LOW (ref 4.22–5.81)
RDW: 18.7 % — ABNORMAL HIGH (ref 11.5–15.5)
WBC: 11.6 10*3/uL — ABNORMAL HIGH (ref 4.0–10.5)

## 2014-09-12 LAB — GLUCOSE, CAPILLARY
GLUCOSE-CAPILLARY: 103 mg/dL — AB (ref 65–99)
GLUCOSE-CAPILLARY: 149 mg/dL — AB (ref 65–99)
Glucose-Capillary: 116 mg/dL — ABNORMAL HIGH (ref 65–99)
Glucose-Capillary: 94 mg/dL (ref 65–99)
Glucose-Capillary: 118 mg/dL — ABNORMAL HIGH (ref 65–99)
Glucose-Capillary: 128 mg/dL — ABNORMAL HIGH (ref 65–99)

## 2014-09-12 LAB — BASIC METABOLIC PANEL
ANION GAP: 10 (ref 5–15)
BUN: 112 mg/dL — AB (ref 6–20)
CO2: 26 mmol/L (ref 22–32)
Calcium: 8 mg/dL — ABNORMAL LOW (ref 8.9–10.3)
Chloride: 104 mmol/L (ref 101–111)
Creatinine, Ser: 3.8 mg/dL — ABNORMAL HIGH (ref 0.61–1.24)
GFR calc non Af Amer: 16 mL/min — ABNORMAL LOW (ref >60)
GFR, EST AFRICAN AMERICAN: 18 mL/min — AB (ref >60)
Glucose, Bld: 123 mg/dL — ABNORMAL HIGH (ref 65–99)
Potassium: 5 mmol/L (ref 3.5–5.1)
Sodium: 140 mmol/L (ref 135–145)

## 2014-09-12 LAB — PREPARE RBC (CROSSMATCH)

## 2014-09-12 MED ORDER — CLONIDINE ORAL SUSPENSION 10 MCG/ML
0.1000 mg | Freq: Three times a day (TID) | ORAL | Status: DC
  Filled 2014-09-12 (×2): qty 10

## 2014-09-12 MED ORDER — CLONIDINE ORAL SUSPENSION 10 MCG/ML: 0.1000 mg | Freq: Three times a day (TID) | ORAL | Status: DC

## 2014-09-12 MED ORDER — CLONIDINE HCL 0.1 MG PO TABS
0.1000 mg | ORAL_TABLET | Freq: Three times a day (TID) | ORAL | Status: DC
  Filled 2014-09-12 (×2): qty 1

## 2014-09-12 MED ORDER — CLONAZEPAM 1 MG PO TABS
1.0000 mg | ORAL_TABLET | Freq: Four times a day (QID) | ORAL | Status: DC
  Administered 2014-09-12 – 2014-09-13 (×5): 1 mg
  Filled 2014-09-12 (×5): qty 1

## 2014-09-12 MED ORDER — CLONIDINE HCL 0.1 MG PO TABS
0.1000 mg | ORAL_TABLET | Freq: Three times a day (TID) | ORAL | Status: DC
  Administered 2014-09-12 – 2014-09-13 (×4): 0.1 mg
  Filled 2014-09-12 (×5): qty 1

## 2014-09-12 MED ORDER — CLONAZEPAM 0.1 MG/ML ORAL SUSPENSION
1.0000 mg | Freq: Four times a day (QID) | ORAL | Status: DC
  Filled 2014-09-12 (×3): qty 10

## 2014-09-12 MED ORDER — PANTOPRAZOLE SODIUM 40 MG PO PACK
40.0000 mg | PACK | Freq: Every day | ORAL | Status: DC
  Administered 2014-09-13: 40 mg
  Filled 2014-09-12 (×2): qty 20

## 2014-09-12 MED ORDER — VANCOMYCIN HCL 10 G IV SOLR
2000.0000 mg | INTRAVENOUS | Status: DC
  Administered 2014-09-12: 2000 mg via INTRAVENOUS
  Filled 2014-09-12: qty 2000

## 2014-09-12 NOTE — Progress Notes (Signed)
CRITICAL VALUE ALERT  Critical value received:  Hg 6.5  Date of notification:  09/12/14  Time of notification:  0640  Critical value read back:Yes.    Nurse who received alert:  Caralee AtesJason Krissy Orebaugh, RN  MD notified (1st page):  Dr Delton CoombesByrum  Time of first page:  645  MD notified (2nd page):  Time of second page:  Responding MD:  Dr. Inis SizerBryum  Time MD responded:  [phone]

## 2014-09-12 NOTE — Progress Notes (Signed)
RN had ELink camera into pt room to witness body movement episode.

## 2014-09-12 NOTE — Progress Notes (Signed)
Telephone call to niece Ceasar Monsalisa Haertel. Verbal okay to transfuse 2 units of blood. Spoke to both this RN and L. Flood Charity fundraiserN. Consent signed.

## 2014-09-12 NOTE — Progress Notes (Signed)
Called by primary RN at assess pt for unusual tremor.  On my initial arrival to the room the pt was alert and his Rt foot was moving only.  He was able to follow commands and make eye contact.  Within a minute he had fine tremors over his entire body and progressed to blinking and a chewing motion with his teeth. The pt had his eyes open but would not respond during the episode.  This lasted for and then the tremors stopped and he was able to f/c.  This appeared much like a simple partial seizure.  Had primary RN contact CCM and update them about the fine rhythmic shaking he is having. Will cont. To monitor

## 2014-09-12 NOTE — Progress Notes (Signed)
eLink Physician-Brief Progress Note Patient Name: Isaiah DarbyWilliam Montgomery DOB: 1950/08/22 MRN: [id number]   Date of Service  09/12/2014  HPI/Events of Note  Notified by RN that pt has had period s  Of tremor, especially when awakened. Not clearly seizure activity. Needs to be evaluated further - consider EEG or Neuro eval in am. I do not believe an urgent consult is needed during the night unless he has a significant change.   eICU Interventions       Intervention Category Intermediate Interventions: Other:  Velencia Lenart S. 09/12/2014, 1:27 AM

## 2014-09-12 NOTE — Progress Notes (Signed)
NG tube in place - placement verified by auscultation.   Order placed for X-ray verification.

## 2014-09-12 NOTE — Progress Notes (Signed)
RN called Rapid Response RN to assess pt for tremor vs seizure activity. Body movement is unusual for tremors and involves whole body. Arms, legs, feets, and face were involved. Pt eyes twitch and mouth makes a grinding movement. Pt is alert but unable to respond during body movement periods. Pt is alert and able to respond directly after body movement stop. Dr. Delton CoombesByrum was notified and April Pugh, RN, with RR assessed pt.

## 2014-09-12 NOTE — Progress Notes (Signed)
PULMONARY / CRITICAL CARE MEDICINE   Name: Isaiah Montgomery MRN: [id number] DOB: 15-Jan-1951    ADMISSION DATE:  09/10/2014   REFERRING MD :  [facility]  CHIEF COMPLAINT:  FTT  INITIAL PRESENTATION:  69 yobm MO, former smoker, tracheotomy dependent due to   OHS, tracheomalacia, underlying anxiety disorder, DM  transferred to [facility] hospital 3/16 with tracheostmy and full ventilator support. He is unable to wean due to his tracheomalacia, MO and concurrent medical issues. He was in SNF division of [facility] when on 5/30 he became febrile to 102. Blood cultured were + MRSA  and urine was + proteus/ecoli. His foley was dc'd 6/2 and [facility] team was unable to replace. He was refused readmit to ICU section of [facility] and transferred to [facility] for further treatment. He is HD stable, no foley is in place. He has a new PICC placed  6/3 and has been on vanc/merropenem x 24 hours at admit 6/3 with  worsening renal failure(creat 4,0) (baseline 3.7)and ? need HD in near future.    Studies 6/4 TTE > Left ventricle: The cavity size was normal. Systolic function was normal. The estimated ejection fraction was in the range of 55% to 60%. Wall motion was normal; there were no regional wall motion abnormalities. There was no evidence of elevated ventricular filling pressure by Doppler parameters. - Mitral valve: There was mild regurgitation. - Left atrium: The atrium was moderately dilated. - Right ventricle: The cavity size was mildly dilated. Wall thickness was normal. Systolic function was moderately reduced. - Right atrium: The atrium was moderately dilated 6/4 renal US>Small bilateral renal cysts but no worrisome renal lesions, hydronephrosis or definite renal calculi 6/4 CT head 1. No acute intracranial findings. 2. Atrophy and chronic white matter vascular disease. 3. Opacification of the mastoid air cells. 4. Mild mucosal thickening in the paranasal sinuses.  Events 09/10/14: Awake  and interacting, mild distress.  Foley placed this am at [facility]    SUBJECTIVE/OVERNIGHT/INTERVAL HX  variably quite agitated, tremulous, calms with ativan IV needs up to 4 mg every 4 hours     VITAL SIGNS: Temp:  [98.8 F (37.1 C)-101.6 F (38.7 C)] 100.8 F (38.2 C) (06/05 1000) Pulse Rate:  [87-137] 122 (06/05 1000) Resp:  [6-40] 24 (06/05 1000) BP: (76-177)/(29-118) 132/43 mmHg (06/05 1000) SpO2:  [90 %-100 %] 92 % (06/05 1000) FiO2 (%):  [40 %-50 %] 40 % (06/05 1000) Weight:  [351 lb 10.1 oz (159.5 kg)] 351 lb 10.1 oz (159.5 kg) (06/05 0600) HEMODYNAMICS:   VENTILATOR SETTINGS: Vent Mode:  [-] PRVC FiO2 (%):  [40 %-50 %] 40 % Set Rate:  [14 bmp] 14 bmp Vt Set:  [600 mL] 600 mL PEEP:  [10 cmH20] 10 cmH20 Plateau Pressure:  [21 cmH20-31 cmH20] 25 cmH20 INTAKE / OUTPUT:  Intake/Output Summary (Last 24 hours) at 09/12/14 1102 Last data filed at 09/12/14 0500  Gross per 24 hour  Intake 3292.5 ml  Output    825 ml  Net 2467.5 ml    PHYSICAL EXAMINATION: General: MOAAM, difficult to arouse. MAEx 4 Neuro:  RASS -2/-3 equivalent. No focal deficits HEENT: Ocular edema noted, perl, oral mucosa moist Cardiovascular:  HSD Lungs:  Min insp and exp rhonchi . Sync with vent Abdomen:  Obese + bs, non tender, ecchymosis spotted on abd Musculoskeletal:  intact Skin:  LE edema, chronic venous stasis   LABS:  PULMONARY  Recent Labs Lab 09/10/14 1143 09/11/14 0355 09/11/14 0950  PHART 7.381 7.357 7.362  PCO2ART  48.1* 47.8* 47.7*  PO2ART 158.0* 87.1 85.8  HCO3 28.5* 25.6* 26.1*  TCO2 30 27.0 27.5  O2SAT 99.0 94.6 95.7    CBC  Recent Labs Lab 09/10/14 1250 09/11/14 0505 09/12/14 0600  HGB 6.4* 7.1* 6.5*  HCT 20.8* 23.5* 20.8*  WBC 12.4* 10.9* 11.6*  PLT 282 349 361    COAGULATION  Recent Labs Lab 09/10/14 1250  INR 1.18    CARDIAC    Recent Labs Lab 09/10/14 1250 09/10/14 1510 09/10/14 2329  TROPONINI <0.03 <0.03 <0.03   No results for  input(s): PROBNP in the last 168 hours.   CHEMISTRY  Recent Labs Lab 09/10/14 1250 09/11/14 0505 09/11/14 2004 09/12/14 0600  NA 133* 139 133* 140  K 5.1 5.6* 5.1 5.0  CL 97* 102 99* 104  CO2 GLUCOSE 138* 94 107* 123*  BUN 106* 107* 111* 112*  CREATININE 3.83* 3.63* 3.70* 3.80*  CALCIUM 8.0* 8.2* 7.9* 8.0*  MG 2.0  --   --   --   PHOS 5.7*  --   --   --    Estimated Creatinine Clearance: 29.1 mL/min (by C-G formula based on Cr of 3.8).   LIVER  Recent Labs Lab 09/10/14 1250  AST 8*  ALT 16*  ALKPHOS 56  BILITOT 0.4  PROT 6.0*  ALBUMIN 2.2*  INR 1.18     INFECTIOUS  Recent Labs Lab 09/10/14 1250 09/10/14 1422  LATICACIDVEN  --  0.8  PROCALCITON 4.43  --      ENDOCRINE CBG (last 3)   Recent Labs  09/12/14 0109 09/12/14 0517 09/12/14 0901  GLUCAP 103* 116* 94         IMAGING x48h  - image(s) personally visualized  -   highlighted in bold Ct Head Wo Contrast  09/11/2014   CLINICAL DATA:  Encephalopathy sepsis and renal failure.  EXAM: CT HEAD WITHOUT CONTRAST  TECHNIQUE: Contiguous axial images were obtained from the base of the skull through the vertex without intravenous contrast.  COMPARISON:  None.  FINDINGS: No acute intracranial hemorrhage. No focal mass lesion. No CT evidence of acute infarction. No midline shift or mass effect. No hydrocephalus. Basilar cisterns are patent.  There is generalized cortical atrophy and mild ventricular dilatation. Mild periventricular white matter hypodensities. Nasogastric tube noted. Some inflammation in the paranasal sinuses. Orbits are normal. There is opacification mastoid air cells.  IMPRESSION: 1. No acute intracranial findings. 2. Atrophy and chronic white matter vascular disease. 3. Opacification of the mastoid air cells. 4. Mild mucosal thickening in the paranasal sinuses.   Electronically Signed   By: Genevive Bi M.D.   On: 09/11/2014 13:11   US Renal  09/11/2014   CLINICAL DATA:   Hematuria  EXAM: RENAL / URINARY TRACT ULTRASOUND COMPLETE  COMPARISON:  None.  FINDINGS: Right Kidney:  Length: 13.8 cm. Normal renal cortical thickness and grossly normal echogenicity. Small cysts are noted. No hydronephrosis. No obvious renal calculi.  Left Kidney:  Length: 14 cm. Normal renal cortical thickness and echogenicity. Small cysts are noted. No hydronephrosis or definite renal calculi.  Bladder:  Decompressed by a Foley catheter.  IMPRESSION: Small bilateral renal cysts but no worrisome renal lesions, hydronephrosis or definite renal calculi.   Electronically Signed   By: Rudie Meyer M.D.   On: 09/11/2014 09:12   Dg Chest Port 1 View  09/11/2014   CLINICAL DATA:  Chronic respiratory failure.  EXAM: PORTABLE CHEST - 1 VIEW  COMPARISON:  None.  FINDINGS: NG tube extends the stomach. Large cardiac silhouette. There is bilateral airspace disease and small effusions. Low lung volumes.  IMPRESSION: Cardiomegaly and pulmonary edema and low lung volumes.   Electronically Signed   By: Genevive BiStewart  Edmunds M.D.   On: 09/11/2014 13:06         ASSESSMENT / PLAN:  PULMONARY Trach 4/16>> A: Chronic vent dependance Chronic trach Tracheomalacia OHS OSA MO   - continues on full vent support    P:   Continue current vent settings, he likely will be on vent life long NPO until Swallow eval to assess aspiration      CARDIOVASCULAR CVL6/2 left picc> A:  HTN CHF   rec  Try clonidine 6/5 which should help with agitation and tachycardia       RENAL A:   Chronic renal insuff, base creatine 3.7   - creat some better with some fluids. Mild Hyperkalemia, resolved  P:   Place foley to accurately measure I/O Follow BMP   GASTROINTESTINAL A:   GERD P:   PPI NPO for now    HEMATOLOGIC A:   Anemia of critical illness - hovering around transfusion threshold. Unable t o consent family - cannot be reached  P:  trx for hbg < 7 6/5 x 2 units since tachycardic    INFECTIOUS A:  MRSA bacteremia E coli / proteus UTI Severe sepsis without shock   - not on presors  P:   BCx2 5/30 mrsa UC 5/30 proteus/ecoli  BC 6/3 x2 >>> Sputum 6/3>>  Abx:  5/30 vanco>> 5/30 Merrem>>  PICC has been changed on 6/2 (after vanco started), ok to leave in place for now  ENDOCRINE A:   DM IDDM P:   SSI  NEUROLOGIC A:  Chronic anxiety  Encephalopathy - ct head 6/4 neg    P:   Trial of clonidine and clonazepam started 6/5          Sandrea HughsMichael Treyvion Durkee, MD Pulmonary and Critical Care Medicine [facility] Cell [phone] After 5:30 PM or weekends, call [phone]

## 2014-09-13 ENCOUNTER — Inpatient Hospital Stay (HOSPITAL_COMMUNITY): Payer: Medicare Other

## 2014-09-13 DIAGNOSIS — E1310 Other specified diabetes mellitus with ketoacidosis without coma: Secondary | ICD-10-CM

## 2014-09-13 LAB — BASIC METABOLIC PANEL
Anion gap: 11 (ref 5–15)
BUN: 112 mg/dL — ABNORMAL HIGH (ref 6–20)
CALCIUM: 8.1 mg/dL — AB (ref 8.9–10.3)
CO2: 24 mmol/L (ref 22–32)
CREATININE: 3.58 mg/dL — AB (ref 0.61–1.24)
Chloride: 106 mmol/L (ref 101–111)
GFR calc non Af Amer: 17 mL/min — ABNORMAL LOW (ref >60)
GFR, EST AFRICAN AMERICAN: 20 mL/min — AB (ref >60)
GLUCOSE: 167 mg/dL — AB (ref 65–99)
Potassium: 4.8 mmol/L (ref 3.5–5.1)
Sodium: 141 mmol/L (ref 135–145)

## 2014-09-13 LAB — CBC
HEMATOCRIT: 23.7 % — AB (ref 39.0–52.0)
Hemoglobin: 7.6 g/dL — ABNORMAL LOW (ref 13.0–17.0)
MCH: 27.8 pg (ref 26.0–34.0)
MCHC: 32.1 g/dL (ref 30.0–36.0)
MCV: 86.8 fL (ref 78.0–100.0)
Platelets: 316 10*3/uL (ref 150–400)
RBC: 2.73 MIL/uL — ABNORMAL LOW (ref 4.22–5.81)
RDW: 17.8 % — ABNORMAL HIGH (ref 11.5–15.5)
WBC: 13 10*3/uL — AB (ref 4.0–10.5)

## 2014-09-13 LAB — LEGIONELLA ANTIGEN, URINE

## 2014-09-13 LAB — CULTURE, RESPIRATORY W GRAM STAIN: Special Requests: NORMAL

## 2014-09-13 LAB — GLUCOSE, CAPILLARY
GLUCOSE-CAPILLARY: 135 mg/dL — AB (ref 65–99)
GLUCOSE-CAPILLARY: 164 mg/dL — AB (ref 65–99)
Glucose-Capillary: 152 mg/dL — ABNORMAL HIGH (ref 65–99)
Glucose-Capillary: 150 mg/dL — ABNORMAL HIGH (ref 65–99)
Glucose-Capillary: 130 mg/dL — ABNORMAL HIGH (ref 65–99)

## 2014-09-13 LAB — CULTURE, RESPIRATORY

## 2014-09-13 MED ORDER — IPRATROPIUM-ALBUTEROL 0.5-2.5 (3) MG/3ML IN SOLN: 3.0000 mL | RESPIRATORY_TRACT | Status: AC

## 2014-09-13 MED ORDER — ASPIRIN 81 MG PO CHEW: 81.0000 mg | CHEWABLE_TABLET | Freq: Every day | ORAL | Status: AC

## 2014-09-13 MED ORDER — DILTIAZEM 12 MG/ML ORAL SUSPENSION: 30.0000 mg | Freq: Four times a day (QID) | ORAL | Status: AC

## 2014-09-13 MED ORDER — DILTIAZEM 12 MG/ML ORAL SUSPENSION
30.0000 mg | Freq: Four times a day (QID) | ORAL | Status: DC
  Administered 2014-09-13 (×2): 30 mg
  Filled 2014-09-13 (×6): qty 3

## 2014-09-13 MED ORDER — VITAL HIGH PROTEIN PO LIQD: 1000.0000 mL | ORAL | Status: AC

## 2014-09-13 MED ORDER — CARVEDILOL 25 MG PO TABS: 25.0000 mg | ORAL_TABLET | Freq: Two times a day (BID) | ORAL | Status: AC

## 2014-09-13 MED ORDER — MUPIROCIN 2 % EX OINT: 1.0000 "application " | TOPICAL_OINTMENT | Freq: Two times a day (BID) | CUTANEOUS | Status: AC

## 2014-09-13 MED ORDER — AMLODIPINE BESYLATE 10 MG PO TABS: 10.0000 mg | ORAL_TABLET | Freq: Every day | ORAL | Status: AC

## 2014-09-13 MED ORDER — CLONAZEPAM 1 MG PO TABS: 1.0000 mg | ORAL_TABLET | Freq: Four times a day (QID) | ORAL | Status: AC

## 2014-09-13 MED ORDER — SODIUM CHLORIDE 0.9 % IV SOLN: 500.0000 mg | Freq: Two times a day (BID) | INTRAVENOUS | Status: AC

## 2014-09-13 MED ORDER — DEXTROSE-NACL 5-0.45 % IV SOLN: INTRAVENOUS | Status: AC

## 2014-09-13 MED ORDER — VANCOMYCIN HCL 10 G IV SOLR: 2000.0000 mg | INTRAVENOUS | Status: AC

## 2014-09-13 MED ORDER — ARIPIPRAZOLE 10 MG PO TABS: 10.0000 mg | ORAL_TABLET | Freq: Every day | ORAL | Status: AC

## 2014-09-13 MED ORDER — INSULIN ASPART 100 UNIT/ML ~~LOC~~ SOLN: 0.0000 [IU] | SUBCUTANEOUS | Status: AC

## 2014-09-13 MED ORDER — FAMOTIDINE 20 MG PO TABS: 20.0000 mg | ORAL_TABLET | Freq: Two times a day (BID) | ORAL | Status: AC

## 2014-09-13 MED ORDER — CETYLPYRIDINIUM CHLORIDE 0.05 % MT LIQD
7.0000 mL | Freq: Four times a day (QID) | OROMUCOSAL | Status: AC
Start: 2014-09-13 — End: ?

## 2014-09-13 MED ORDER — PANTOPRAZOLE SODIUM 40 MG PO PACK: 40.0000 mg | PACK | Freq: Every day | ORAL | Status: AC

## 2014-09-13 MED FILL — Perflutren Lipid Microsphere IV Susp 1.1 MG/ML: INTRAVENOUS | Qty: 10 | Status: AC

## 2014-09-13 NOTE — Clinical Documentation Improvement (Signed)
Presents with MRSA Sepsis, UTI, Encephalopathy, Morbidly Obese, ARF. CHF is documented.   ECHO reveals: EF of 55 to 60% with systolic function moderately reduced; left and right atrium moderately dilated  Was being treated with Lopressor but discontinued  Please clarify the type and acuity of the patient's CHF and document findings in next progress note and include in discharge summary if applicable.   Acuity - acute, chronic, acute on chronic  Typre - Systolic, Diastolic, Systolic and Diastolic  Other Condition________________________________________  Cannot Clinically Determine  Thank You, Shellee MiloEileen T Geral Tuch ,RN Clinical Documentation Specialist:  [phone]  [facility]- Health Information Management

## 2014-09-13 NOTE — Clinical Documentation Improvement (Signed)
Patient with Sepsis; is trached and vent dependent; morbidly obese with BMI of 55.Isaiah Montgomery.  Please provide a diagnosis associated with the above clinical indicators and document findings in next progress note and include in discharge summary if applicable.  Acute Respiratory Failure Acute on Chronic Respiratory Failure Chronic Respiratory Failure Other Condition Cannot Clinically Determine   Thank You, Shellee MiloEileen T Zailee Vallely ,RN Clinical Documentation Specialist:  [phone]  [facility]- Health Information Management

## 2014-09-13 NOTE — Discharge Summary (Signed)
Physician Discharge Summary       Patient ID: Isaiah Montgomery MRN: [id number] DOB/AGE: [birth year] 63 y.o.  Admit date: 09/10/2014 Discharge date: 09/13/2014  Discharge Diagnoses:  Tracheostomy dependence  OHS/OSA Tracheomalacia  Chronic Ventilator Dependence  Severe sepsis MRSA bacteremia  Proteus and Ecoli UTI CRI stage IV CKD Acute encephalopathy  Hyperkalemia    Detailed Hospital Course:  Mr. Isaiah Montgomery is a MO, AAM, former smoker, tracheotomy dependent due to MO, OHS, tracheomalacia, underlying anxiety disorder, DM who wa transferred to [facility] 3/16 with tracheostmy and full ventilator support. He is unable to wean due to his tracheomalacia, MO and concurrent medical issues. He was in SNF division [facility] when on 5/30 he became febrile to 102. Blood cultured were + Staph(mrsa) and urine was + proteus/ecoli. His foley was dc'd 6/2 and [facility] team was unable to replace.He was  [facility]. He is HD stable, no foley is in place. He has a new PICC placed 6/3 and has been on vanc/merropenem x 24 hours. He further has worsening renal failure(cret 4,0) (baseline 3.7) [facility]  [facility] with continuation of antibiotics and supportive care. He remained hemodynamically stable. His creatinine hit plateau, his fever curve has been stable. The only new culture data identified was: moderate Stenotrophomonas maltophilia which we felt was a colonization. F/U urine showed insignificant growth, f/u blood cultures remain negative to date. At time of discharge his most active issue remains residual encephalopathy. His head CT was negative. This is likely residual from sepsis. This we think makes him require higher level of care than SNF, but would [facility]. He is ready for transfer back to [facility] as of 6/6 with the following plan of care.    Discharge Plan by active problems   Severe sepsis in  setting of MRSA bacteremia AND E coli / proteus UTI Plan Complete minimally another 4 weeks of IV vanc for bacteremia already treated at [facility] Complete 3 more days of Merrem   Acute Encephalopathy -->in setting of sepsis Plan Continue supportive care.  Decreased clonazepam started 6/5  Hope that w/ more time his MS will return to baseline   Chronic vent dependance Chronic trach Tracheomalacia OHS OSA MO - continues on full vent support  Plan:  Continue current vent settings, he likely will be on vent life long NPO until Swallow eval to assess aspiration  HTN CHF Plan Try clonidine 6/5 which should help with agitation and tachycardia  Chronic renal insuff, base creatine 3.7 - creat some better with some fluids. Mild Hyperkalemia, resolved Plan:  Place foley to accurately measure I/O Follow BMP  Significant Hospital tests/ studies  Consults:none   ECHO 6/4: The cavity size was normal. Systolic function wasnormal. The estimated ejection fraction was in the range of 55%to 60%. Wall motion was normal; there were no regional wall motion abnormalities. There was no evidence of elevatedventricular filling pressure by Doppler parameters.- Mitral valve: There was mild regurgitation.- Left atrium: The atrium was moderately dilated. - Right ventricle: The cavity size was mildly dilated. Wallthickness was normal. Systolic function was moderately reduced.  Discharge Exam: BP 130/81 mmHg  Pulse 97  Temp(Src) 100.2 F (37.9 C) (Axillary)  Resp 17  Ht 5\' 6"  (1.676 m)  Wt 163.295 kg (360 lb)  BMI 58.13 kg/m2  SpO2 95%  General: MOAAM, difficult to arouse. MAEx 4 Neuro: RASS -2/-3 equivalent. No focal deficits HEENT: Ocular  edema noted, perl, oral mucosa moist Cardiovascular: HSD Lungs: Min insp and exp rhonchi . Sync with vent Abdomen: Obese + bs, non tender, ecchymosis spotted on abd Musculoskeletal: intact Skin: LE edema, chronic venous stasis    Labs at discharge Lab Results  Component Value Date   CREATININE 3.58* 09/13/2014   BUN 112* 09/13/2014   NA 141 09/13/2014   K 4.8 09/13/2014   CL 106 09/13/2014   CO2 24 09/13/2014   Lab Results  Component Value Date   WBC 13.0* 09/13/2014   HGB 7.6* 09/13/2014   HCT 23.7* 09/13/2014   MCV 86.8 09/13/2014   PLT 316 09/13/2014   Lab Results  Component Value Date   ALT 16* 09/10/2014   AST 8* 09/10/2014   ALKPHOS 56 09/10/2014   BILITOT 0.4 09/10/2014   Lab Results  Component Value Date   INR 1.18 09/10/2014    Current radiology studies Dg Abd 1 View  09/12/2014   CLINICAL DATA:  NG tube placement.  EXAM: ABDOMEN - 1 VIEW  COMPARISON:  None.  FINDINGS: NG tube tip is in the fundus of the stomach.  No dilated bowel.  Visualized osseous structures are normal.  IMPRESSION: NG tube tip is in the fundus of the stomach.   Electronically Signed   By: Francene Boyers M.D.   On: 09/12/2014 15:33   Dg Abd Portable 1v  09/13/2014   CLINICAL DATA:  64 year old male NG tube placement. Initial encounter.  EXAM: PORTABLE ABDOMEN - 1 VIEW  COMPARISON:  09/12/2014.  FINDINGS: Portable AP supine view at 0835 hours. Enteric tube in place, side hole projects at the level of the distal gastric body just across midline. Similar non obstructed bowel gas pattern with gas in multiple nondilated bowel loops in the stomach. Stable visualized osseous structures.  IMPRESSION: NG tube in place, side hole at the distal gastric body.   Electronically Signed   By: [name] M.D.   On: 09/13/2014 09:01    Disposition:  Final discharge disposition not confirmed      Discharge Instructions    Diet - low sodium heart healthy    Complete by:  As directed      Increase activity slowly    Complete by:  As directed   Continue full vent support Vent Mode:  [-] PRVC FiO2 (%):  [40 %] 40 % Set Rate:  [14 bmp] 14 bmp Vt Set:  [600 mL] 600 mL PEEP:  [10 cmH20] 10 cmH20            Medication List    STOP  taking these medications        acetaminophen 325 MG tablet  Commonly known as:  TYLENOL     alum & mag hydroxide-simeth 200-200-20 MG/5ML suspension  Commonly known as:  MAALOX/MYLANTA     bisacodyl 10 MG suppository  Commonly known as:  DULCOLAX     budesonide 0.5 MG/2ML nebulizer solution  Commonly known as:  PULMICORT     diltiazem 120 MG tablet  Commonly known as:  CARDIZEM  Replaced by:  diltiazem 10 mg/ml oral suspension     docusate sodium 100 MG capsule  Commonly known as:  COLACE     eucerin cream     gabapentin 100 MG capsule  Commonly known as:  NEURONTIN     hydrALAZINE 25 MG tablet  Commonly known as:  APRESOLINE     ibuprofen 200 MG tablet  Commonly known as:  ADVIL,MOTRIN  LORazepam 2 MG/ML concentrated solution  Commonly known as:  ATIVAN     meropenem in sodium chloride 0.9 % 100 mL  Replaced by:  meropenem 500 mg in sodium chloride 0.9 % 50 mL     nitroGLYCERIN 0.4 MG SL tablet  Commonly known as:  NITROSTAT     oxyCODONE-acetaminophen 5-325 MG per tablet  Commonly known as:  PERCOCET/ROXICET     predniSONE 20 MG tablet  Commonly known as:  DELTASONE     sodium chloride 0.45 % solution      TAKE these medications        amLODipine 10 MG tablet  Commonly known as:  NORVASC  Place 1 tablet (10 mg total) into feeding tube daily.     antiseptic oral rinse 0.05 % Liqd solution  Commonly known as:  CPC / CETYLPYRIDINIUM CHLORIDE 0.05%  7 mLs by Mouth Rinse route QID.     ARIPiprazole 10 MG tablet  Commonly known as:  ABILIFY  Place 1 tablet (10 mg total) into feeding tube daily.     aspirin 81 MG chewable tablet  Place 1 tablet (81 mg total) into feeding tube daily.     carvedilol 25 MG tablet  Commonly known as:  COREG  Place 1 tablet (25 mg total) into feeding tube 2 (two) times daily with a meal.     chlorhexidine 0.12 % solution  Commonly known as:  PERIDEX  Use as directed 15 mLs in the mouth or throat 2 (two) times daily.       clonazePAM 1 MG tablet  Commonly known as:  KLONOPIN  Place 1 tablet (1 mg total) into feeding tube 4 (four) times daily.     cycloSPORINE 0.05 % ophthalmic emulsion  Commonly known as:  RESTASIS  Place 1 drop into both eyes 2 (two) times daily.     dextrose 5 % and 0.45% NaCl infusion  75 ml/hr     diltiazem 10 mg/ml oral suspension  Commonly known as:  CARDIZEM  Place 3 mLs (30 mg total) into feeding tube every 6 (six) hours.     famotidine 20 MG tablet  Commonly known as:  PEPCID  Place 1 tablet (20 mg total) into feeding tube 2 (two) times daily.     feeding supplement (VITAL HIGH PROTEIN) Liqd liquid  Place 1,000 mLs into feeding tube daily.     heparin 5000 UNIT/ML injection  Inject 5,000 Units into the skin every 12 (twelve) hours.     insulin aspart 100 UNIT/ML injection  Commonly known as:  novoLOG  Inject 0-15 Units into the skin every 4 (four) hours.     insulin detemir 100 UNIT/ML injection  Commonly known as:  LEVEMIR  Inject 25 Units into the skin 2 (two) times daily.     ipratropium-albuterol 0.5-2.5 (3) MG/3ML Soln  Commonly known as:  DUONEB  Take 3 mLs by nebulization every 4 (four) hours.     isosorbide dinitrate 20 MG tablet  Commonly known as:  ISORDIL  Take 20 mg by mouth 3 (three) times daily.     lactulose 10 GM/15ML solution  Commonly known as:  CHRONULAC  Take 40 g by mouth 2 (two) times daily.     meropenem 500 mg in sodium chloride 0.9 % 50 mL  Inject 500 mg into the vein every 12 (twelve) hours.     mupirocin ointment 2 %  Commonly known as:  BACTROBAN  Place 1 application into the nose 2 (two) times  daily.     pantoprazole sodium 40 mg/20 mL Pack  Commonly known as:  PROTONIX  Place 20 mLs (40 mg total) into feeding tube daily at 12 noon.     sertraline 100 MG tablet  Commonly known as:  ZOLOFT  Take 100 mg by mouth at bedtime.     tamsulosin 0.4 MG Caps capsule  Commonly known as:  FLOMAX  Take 0.4 mg by mouth daily.      vancomycin 2,000 mg in sodium chloride 0.9 % 500 mL  Inject 2,000 mg into the vein every other day.     vitamin A & D ointment  Apply 1 application topically as needed for dry skin.         Discharged Condition: fair  Physician Statement:   The Patient was personally examined, the discharge assessment and plan has been personally reviewed and I agree with ACNP Babcock's assessment and plan. > 30 minutes of time have been dedicated to discharge assessment, planning and discharge instructions.   SignedAnders Simmonds 09/13/2014, 2:22 PM   Billy Fischer, MD ; [facility] service Mobile [phone].  After 5:30 PM or weekends, call [phone]

## 2014-09-13 NOTE — Progress Notes (Signed)
Report called to Zola Buttonoug Arno, RN at [facility]. Patient will be transported by [facility] to room 209, Dr. Petra KubaKilpatrick admitting MD. Will await [facility]'s arrival and continue to monitor. - Asher MuirJamie Kharisma Glasner,RN

## 2014-09-13 NOTE — Progress Notes (Signed)
Pt was restless and HR 148. RN went to check on pt and NG tube was taken out by pt. RN Called e-link. Received order to advance NG tube and STAT DG X-ray.

## 2014-09-13 NOTE — Progress Notes (Signed)
Pt HR 146. Called e-link to change Oral cardizem to IV due to NG tube inplacement. No new orders received.

## 2014-09-13 NOTE — Progress Notes (Signed)
Error  Maeby Vankleeck E Moritz Lever ACNP-BC [place] Pulmonary/Critical Care Pager # [phone] OR # [phone] if no answer  

## 2014-09-13 NOTE — Progress Notes (Signed)
ANTIBIOTIC CONSULT NOTE - Follow-up  Pharmacy Consult for Merrem, Vancomycin Indication: rule out sepsis and MDR EColi & Proteus UTI + MRSA bacteremia  Allergies  Allergen Reactions  . Amoxicillin     rash  . Bactrim [Sulfamethoxazole-Trimethoprim] Other (See Comments)  . Carrot [Daucus Carota] Other (See Comments)  . Fentanyl   . Shellfish-Derived Products Other (See Comments)   Patient Measurements: Height: 5\' 6"  (167.6 cm) Weight: (!) 360 lb (163.295 kg) IBW/kg (Calculated) : 63.8  Weight - 156.5 kg  Vital Signs: Temp: 101.8 F (38.8 C) (06/06 0817) Temp Source: Axillary (06/06 0817) BP: 153/85 mmHg (06/06 0900) Pulse Rate: 123 (06/06 0900) Intake/Output from previous day: 06/05 0701 - 06/06 0700 In: 1725 [I.V.:160; Blood:635; NG/GT:880; IV Piggyback:50] Out: 700 [Urine:700] Intake/Output from this shift:    Labs:  Recent Labs  09/11/14 0505 09/11/14 2004 09/12/14 0600 09/13/14 0256  WBC 10.9*  --  11.6* 13.0*  HGB 7.1*  --  6.5* 7.6*  PLT 349  --  361 316  CREATININE 3.63* 3.70* 3.80* 3.58*   Estimated Creatinine Clearance: 31.4 mL/min (by C-G formula based on Cr of 3.58). No results for input(s): VANCOTROUGH, VANCOPEAK, VANCORANDOM, GENTTROUGH, GENTPEAK, GENTRANDOM, TOBRATROUGH, TOBRAPEAK, TOBRARND, AMIKACINPEAK, AMIKACINTROU, AMIKACIN in the last 72 hours.   Microbiology: Recent Results (from the past 720 hour(s))  MRSA PCR Screening     Status: Abnormal   Collection Time: 09/10/14 11:00 AM  Result Value Ref Range Status   MRSA by PCR POSITIVE (A) NEGATIVE Final    Comment:        The GeneXpert MRSA Assay (FDA approved for NASAL specimens only), is one component of a comprehensive MRSA colonization surveillance program. It is not intended to diagnose MRSA infection nor to guide or monitor treatment for MRSA infections. CRITICAL RESULT CALLED TO, READ BACK BY AND VERIFIED WITH: RN BEABRAUT,J AT 1140 ON [id number] BY MARTINB   Urine culture      Status: None   Collection Time: 09/10/14 12:44 PM  Result Value Ref Range Status   Specimen Description URINE, RANDOM  Final   Special Requests Normal  Final   Colony Count   Final    8,000 COLONIES/ML Performed at [facility]    Culture   Final    INSIGNIFICANT GROWTH Performed at [facility]    Report Status 09/11/2014 FINAL  Final  Culture, respiratory (tracheal aspirate)     Status: None   Collection Time: 09/10/14 12:53 PM  Result Value Ref Range Status   Specimen Description TRACHEAL ASPIRATE  Final   Special Requests Normal  Final   Gram Stain   Final    MODERATE WBC PRESENT,BOTH PMN AND MONONUCLEAR NO SQUAMOUS EPITHELIAL CELLS SEEN RARE GRAM NEGATIVE RODS Performed at [facility]    Culture   Final    MODERATE STENOTROPHOMONAS MALTOPHILIA Performed at [facility]    Report Status 09/13/2014 FINAL  Final  Culture, blood (routine x 2)     Status: None (Preliminary result)   Collection Time: 09/10/14  2:10 PM  Result Value Ref Range Status   Specimen Description BLOOD RIGHT ANTECUBITAL  Final   Special Requests BOTTLES DRAWN AEROBIC ONLY 10CC  Final   Culture   Final           BLOOD CULTURE RECEIVED NO GROWTH TO DATE CULTURE WILL BE HELD FOR 5 DAYS BEFORE ISSUING A FINAL NEGATIVE REPORT Performed at [facility]    Report Status PENDING  Incomplete  Culture, blood (routine x 2)     Status: None (Preliminary result)   Collection Time: 09/10/14  2:22 PM  Result Value Ref Range Status   Specimen Description BLOOD RIGHT ANTECUBITAL  Final   Special Requests BOTTLES DRAWN AEROBIC ONLY 10CC  Final   Culture   Final           BLOOD CULTURE RECEIVED NO GROWTH TO DATE CULTURE WILL BE HELD FOR 5 DAYS BEFORE ISSUING A FINAL NEGATIVE REPORT Performed at [facility]    Report Status PENDING  Incomplete   Assessment: 38 YOM transferred from [facility] (3/24 to 6/3) on 6/3 for sepsis after becoming febrile up to 102 on  5/30 with MRSA bacteremia and Proteus/EColi MDR UTI on vancomycin and merrem since 5/30. Per [facility] chart - Merrem 1g 5/31 then 500 q24 & Vanc 1g 5/31 x1, then VR level on 6/1 low at 7.3 - no further doses documented. Last dose Merrem was 6/2 at 2100. Last dose of vanc 5/31-none documented on June MAR.  Currently with Tmax of 101.8 and WBC elevated at 13. SCr also elevated at 3.58 but down from yesterday. New trach aspirate cultures reported today with stenotrophomonas sensitive to levaquin + bactrim. Unclear if active infection vs colonization.   Vanc 5/31 >> VR 6/1 =7.3 Merrem 5/31 >> (500 q24 @ [facility]) Cefepime 4/29 >>5/1  5/27 Blood >> MRSA 5/29 Urine >> MDR Ecoli/Proteus (S- Imipenem) 6/3 Blood>NGTD 6/3 TA>mod stenotrophomonas maltophilia (sens to levaquin + bactrim - sensitivies faxed from [facility] and placed in shadow chart) 6/3 Urine> insig growth  Goal of Therapy:  Vancomycin trough level 15-20 mcg/ml  Clinical resolution of infection  Plan:  - Continue vancomycin 2gm IV Q48H - Continue meropenem  IV Q12H - F/u renal fxn, C&S, clinical status and trough at [facility] - MD - Please evaluate if treatment needed for stenotrophomonas in trach aspirate  Lysle Pearl, PharmD, BCPS Pager # [phone] 09/13/2014 11:40 AM\

## 2014-09-14 LAB — TYPE AND SCREEN
ABO/RH(D): B POS
ANTIBODY SCREEN: NEGATIVE
UNIT DIVISION: 0
Unit division: 0
Unit division: 0

## 2014-09-16 LAB — CULTURE, BLOOD (ROUTINE X 2): CULTURE: NO GROWTH

## 2014-09-17 LAB — CULTURE, BLOOD (ROUTINE X 2): CULTURE: NO GROWTH

## 2014-09-27 ENCOUNTER — Other Ambulatory Visit (HOSPITAL_COMMUNITY): Payer: Self-pay | Admitting: Specialist

## 2014-09-27 DIAGNOSIS — R918 Other nonspecific abnormal finding of lung field: Secondary | ICD-10-CM

## 2014-10-01 ENCOUNTER — Ambulatory Visit (HOSPITAL_COMMUNITY)
Admission: RE | Admit: 2014-10-01 | Discharge: 2014-10-01 | Disposition: A | Discharge status: Home / Self Care | Payer: Medicare Other | Source: Ambulatory Visit | Attending: Specialist | Admitting: Specialist

## 2014-10-01 DIAGNOSIS — R918 Other nonspecific abnormal finding of lung field: Secondary | ICD-10-CM | POA: Insufficient documentation

## 2014-10-01 DIAGNOSIS — I700 Atherosclerosis of aorta: Secondary | ICD-10-CM | POA: Diagnosis not present

## 2014-10-01 DIAGNOSIS — J90 Pleural effusion, not elsewhere classified: Secondary | ICD-10-CM | POA: Insufficient documentation

## 2014-10-01 DIAGNOSIS — R590 Localized enlarged lymph nodes: Secondary | ICD-10-CM | POA: Diagnosis not present

## 2014-10-01 DIAGNOSIS — Z9911 Dependence on respirator [ventilator] status: Secondary | ICD-10-CM | POA: Insufficient documentation

## 2014-10-01 DIAGNOSIS — R609 Edema, unspecified: Secondary | ICD-10-CM | POA: Diagnosis not present

## 2014-10-01 DIAGNOSIS — Z930 Tracheostomy status: Secondary | ICD-10-CM | POA: Diagnosis not present

## 2014-10-18 ENCOUNTER — Other Ambulatory Visit (HOSPITAL_COMMUNITY): Payer: Self-pay | Admitting: Internal Medicine

## 2014-10-18 DIAGNOSIS — R109 Unspecified abdominal pain: Secondary | ICD-10-CM

## 2014-10-21 ENCOUNTER — Ambulatory Visit (HOSPITAL_COMMUNITY)
Admission: RE | Admit: 2014-10-21 | Discharge: 2014-10-21 | Disposition: A | Discharge status: Home / Self Care | Payer: Medicare Other | Source: Ambulatory Visit | Attending: Internal Medicine | Admitting: Internal Medicine

## 2014-10-21 DIAGNOSIS — N289 Disorder of kidney and ureter, unspecified: Secondary | ICD-10-CM | POA: Diagnosis not present

## 2014-10-21 DIAGNOSIS — R1084 Generalized abdominal pain: Secondary | ICD-10-CM | POA: Insufficient documentation

## 2014-10-21 DIAGNOSIS — R109 Unspecified abdominal pain: Secondary | ICD-10-CM

## 2014-10-21 DIAGNOSIS — Z931 Gastrostomy status: Secondary | ICD-10-CM | POA: Insufficient documentation

## 2014-10-21 DIAGNOSIS — I517 Cardiomegaly: Secondary | ICD-10-CM | POA: Insufficient documentation

## 2015-03-10 DEATH — deceased

## 2016-02-09 IMAGING — CT CT CHEST W/O CM
2 of 5 series · 13 of 36 positions shown, 15 images · non-contrast
Comparison: None.

CLINICAL DATA: Sepsis. Ventilator dependent with tracheostomy tube.

EXAM:
CT CHEST WITHOUT CONTRAST
TECHNIQUE: Multidetector CT imaging of the chest was performed following the
standard protocol without IV contrast.

[Series 204: axials · axial · 0.88mm/px · z∈[-433,-233]mm · 7 of 54 slices shown, 9 images (1 of 2)]
[im 7/54  mediastinal]
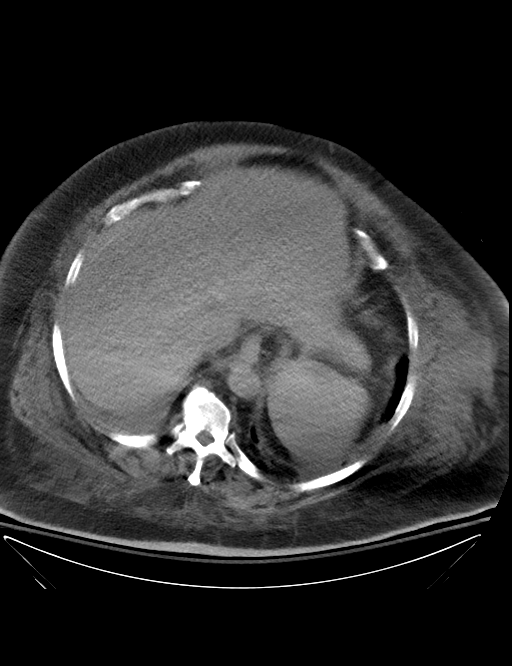
[im 7/54  lung]
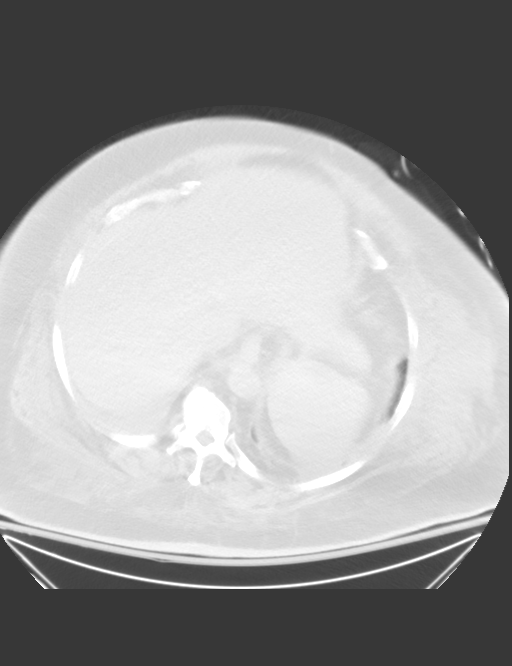
[im 14/54  lung]
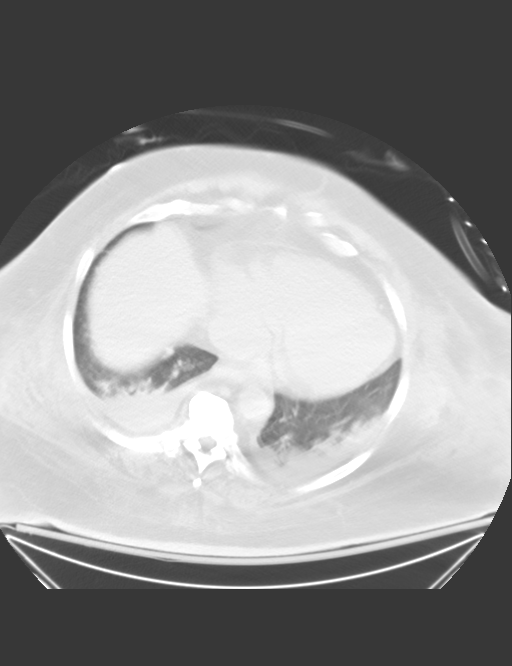
[im 20/54  lung]
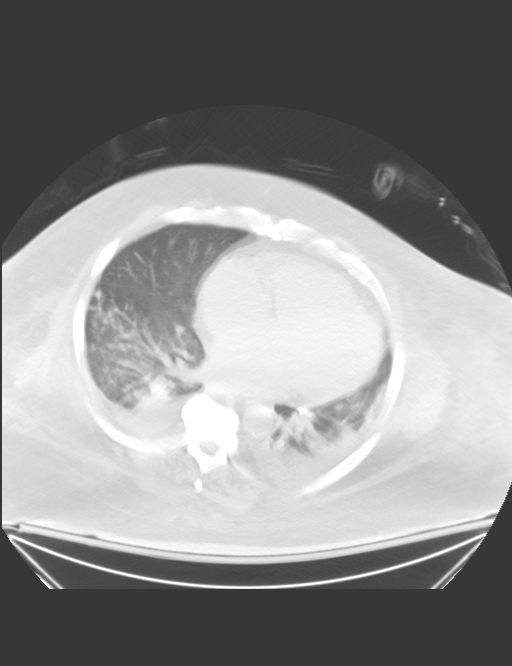
[im 27/54  lung]
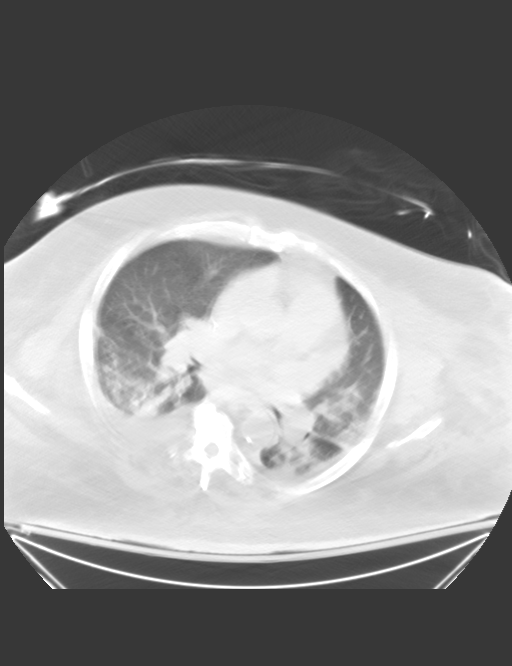
[im 34/54  mediastinal]
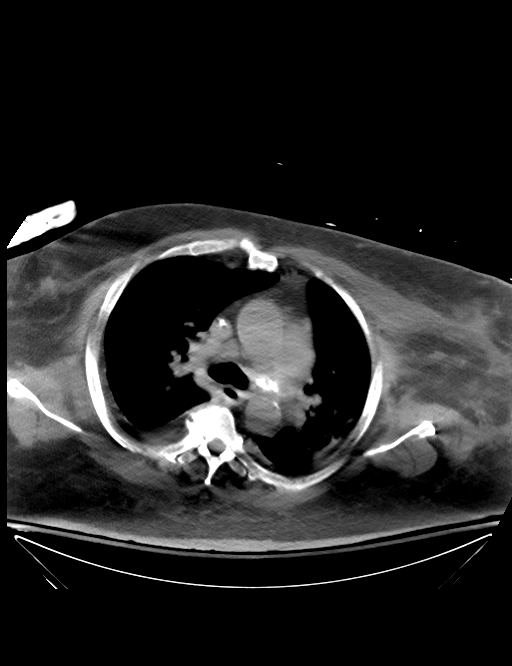
[im 34/54  lung]
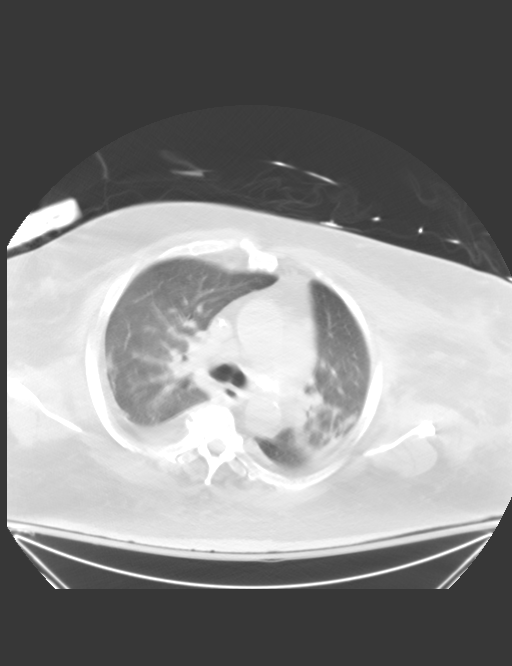
[im 40/54  lung]
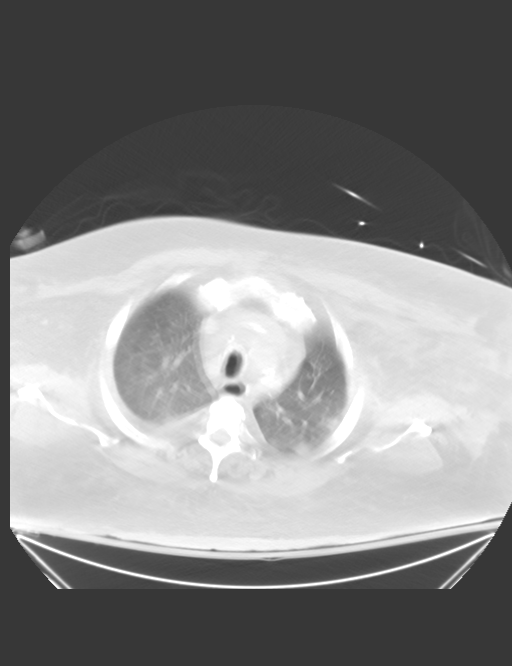
[im 47/54  lung]
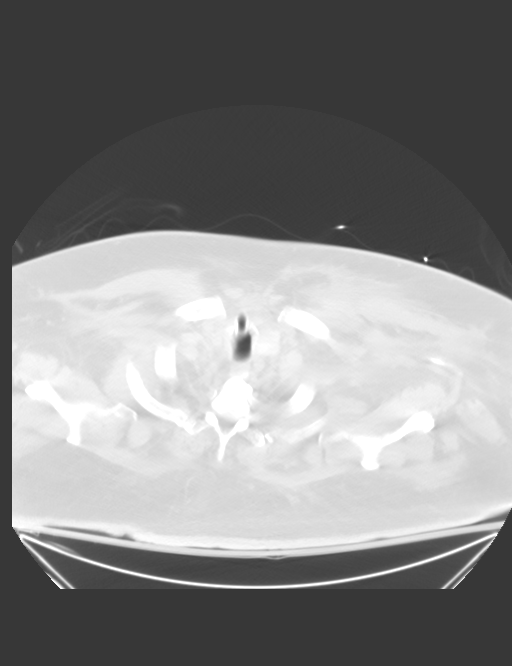

[Series 205: axials · axial · 0.88mm/px · z∈[-421,-236]mm · 6 of 52 slices shown (2 of 2)]
[im 8/52  lung]
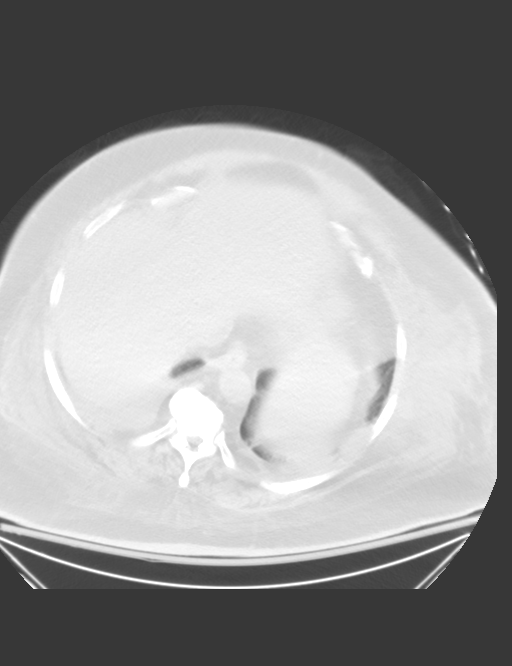
[im 15/52  lung]
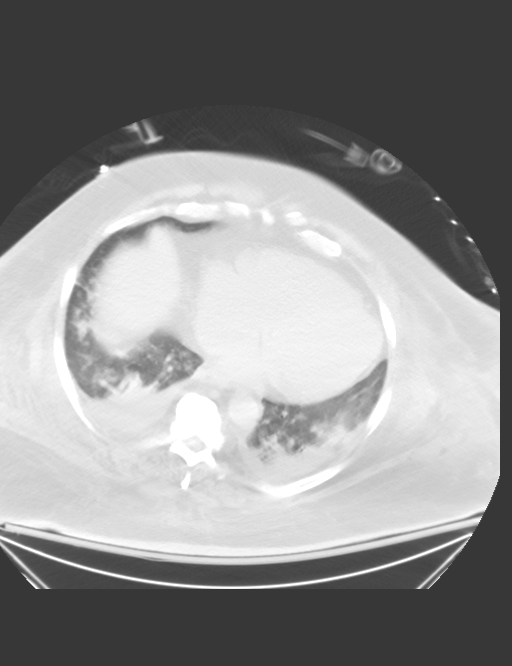
[im 22/52  lung]
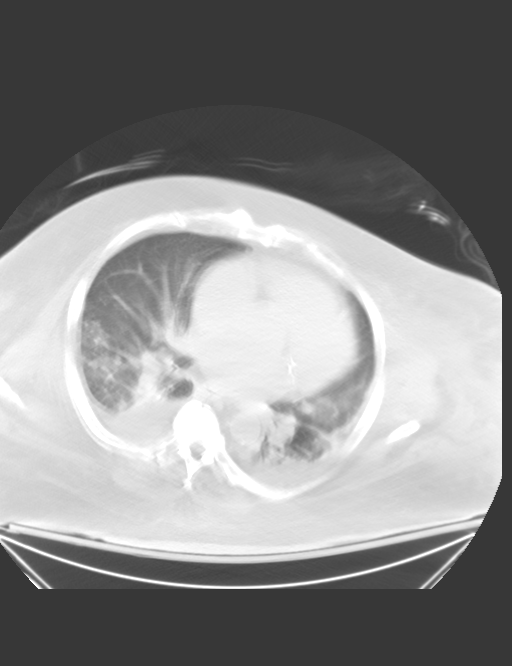
[im 30/52  lung]
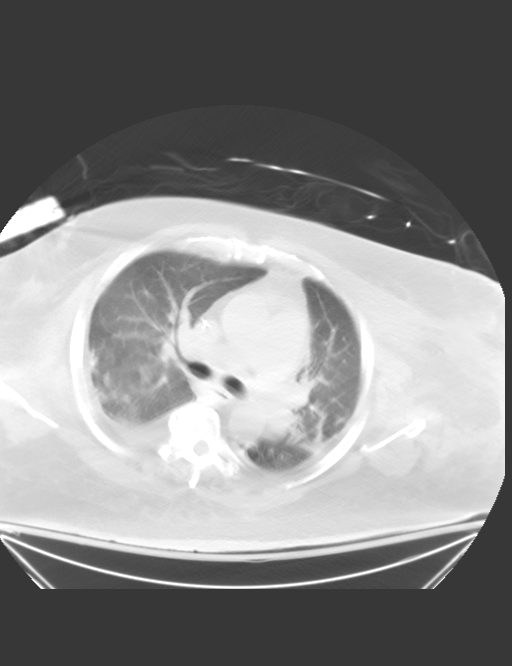
[im 37/52  lung]
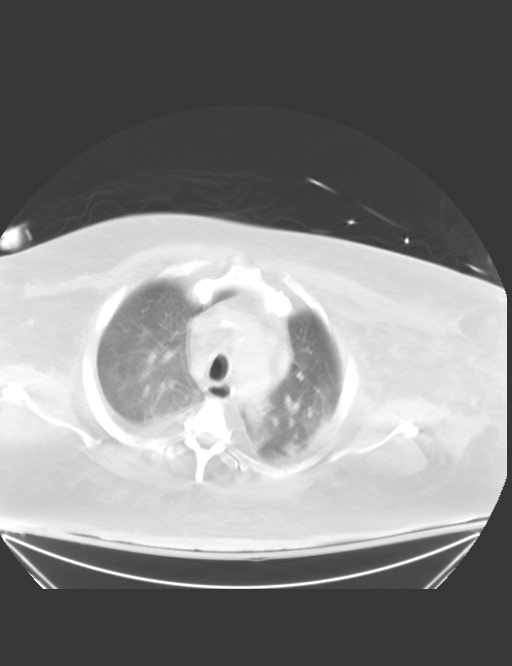
[im 44/52  lung]
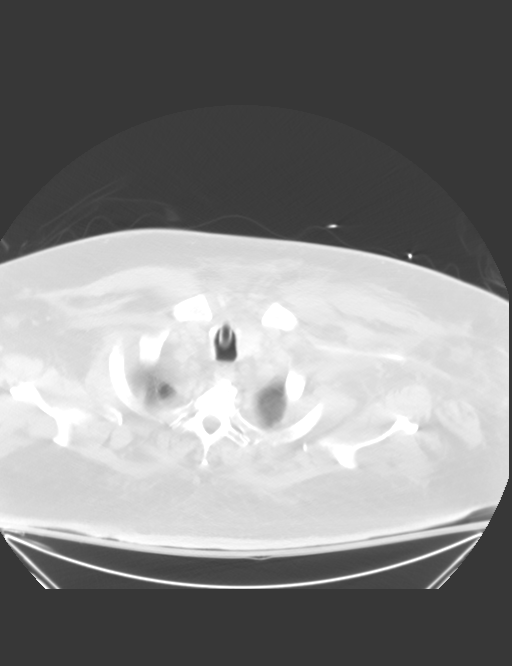

[13 of 36 positions shown; findings below may reference images not displayed]

FINDINGS: Chest wall: Diffuse chest wall edema suggesting anasarca. Morbid
obesity. No discrete mass. High attenuation material in the left
axilla/chest wall area could be a hematoma in serratus anterior
muscle. The bony thorax is grossly intact.

Mediastinum: The tracheostomy tube is in good position. The heart is
mildly enlarged. No pericardial effusion. Enlarged mediastinal lymph
nodes may be inflammatory or infectious. Prevascular node on image
number 28 measures 11.5 mm.

Pretracheal node on image 33 measures 16.5 mm.

Pretracheal node on image number 32 measures 11 mm.

Moderate atherosclerotic calcifications involving the aorta and
branch vessels. The esophagus is grossly normal.

Lungs/ pleura: Exam is limited by respiratory motion. There are
small bilateral pleural effusions, right greater than left. Diffuse
ground-glass opacity in the lungs suggesting edema. There are also
patchy areas of atelectasis and bibasilar infiltrates.

Upper abdomen: No significant findings of and diffuse body wall
edema. More focal fluid is noted in and around the left Latissimus
dorsi muscle.
IMPRESSION: 1. Diffuse body wall edema suggesting anasarca. There is also a
probable intermuscular hematoma involving the left serratus anterior
muscle and focal fluid around the left latissimus dorsi muscle.
2. Bilateral pleural effusions, patchy atelectasis, pulmonary edema
and bibasilar infiltrates.
3. Enlarged mediastinal lymph nodes are likely
inflammatory/hyperplastic.
